# Patient Record
Sex: Female | Born: 1970 | Race: Black or African American | Hispanic: No | Marital: Single | Smoking: Never smoker
Health system: Southern US, Community
[De-identification: ages and names within clinical notes are randomized; demographics above are authoritative.]

## PROBLEM LIST (undated history)

## (undated) DIAGNOSIS — J45909 Unspecified asthma, uncomplicated: Secondary | ICD-10-CM

## (undated) DIAGNOSIS — E785 Hyperlipidemia, unspecified: Secondary | ICD-10-CM

## (undated) DIAGNOSIS — I10 Essential (primary) hypertension: Secondary | ICD-10-CM

## (undated) HISTORY — DX: Hyperlipidemia, unspecified: E78.5

## (undated) HISTORY — DX: Essential (primary) hypertension: I10

## (undated) HISTORY — DX: Unspecified asthma, uncomplicated: J45.909

## (undated) HISTORY — PX: TUBAL LIGATION: SHX77

---

## 2004-06-01 ENCOUNTER — Emergency Department (HOSPITAL_COMMUNITY): Admission: EM | Admit: 2004-06-01 | Discharge: 2004-06-01 | Payer: Self-pay | Admitting: Family Medicine

## 2005-08-09 ENCOUNTER — Inpatient Hospital Stay (HOSPITAL_COMMUNITY): Admission: AD | Admit: 2005-08-09 | Discharge: 2005-08-09 | Payer: Self-pay | Admitting: *Deleted

## 2006-05-26 ENCOUNTER — Other Ambulatory Visit: Admission: RE | Admit: 2006-05-26 | Discharge: 2006-05-26 | Payer: Self-pay | Admitting: Obstetrics and Gynecology

## 2010-07-13 ENCOUNTER — Encounter: Payer: Self-pay | Admitting: Family Medicine

## 2011-09-15 ENCOUNTER — Ambulatory Visit (INDEPENDENT_AMBULATORY_CARE_PROVIDER_SITE_OTHER): Payer: BC Managed Care – PPO | Admitting: Internal Medicine

## 2011-09-15 ENCOUNTER — Telehealth: Payer: Self-pay

## 2011-09-15 VITALS — BP 128/90 | HR 80 | Temp 98.3°F | Resp 18 | Ht 64.25 in | Wt 186.0 lb

## 2011-09-15 DIAGNOSIS — E669 Obesity, unspecified: Secondary | ICD-10-CM

## 2011-09-15 DIAGNOSIS — Z833 Family history of diabetes mellitus: Secondary | ICD-10-CM

## 2011-09-15 DIAGNOSIS — R209 Unspecified disturbances of skin sensation: Secondary | ICD-10-CM

## 2011-09-15 DIAGNOSIS — M7989 Other specified soft tissue disorders: Secondary | ICD-10-CM

## 2011-09-15 DIAGNOSIS — R200 Anesthesia of skin: Secondary | ICD-10-CM

## 2011-09-15 DIAGNOSIS — D649 Anemia, unspecified: Secondary | ICD-10-CM

## 2011-09-15 LAB — POCT CBC
Granulocyte percent: 46.2 %G (ref 37–80)
HCT, POC: 35.2 % — AB (ref 37.7–47.9)
Hemoglobin: 11.2 g/dL — AB (ref 12.2–16.2)
Lymph, poc: 1.5 (ref 0.6–3.4)
MCH, POC: 29.3 pg (ref 27–31.2)
MCHC: 31.8 g/dL (ref 31.8–35.4)
MID (cbc): 0.3 (ref 0–0.9)
MPV: 10 fL (ref 0–99.8)
POC LYMPH PERCENT: 43.8 %L (ref 10–50)
Platelet Count, POC: 272 10*3/uL (ref 142–424)
RBC: 3.82 M/uL — AB (ref 4.04–5.48)
WBC: 3.5 10*3/uL — AB (ref 4.6–10.2)

## 2011-09-15 LAB — POCT UA - MICROSCOPIC ONLY
Crystals, Ur, HPF, POC: NEGATIVE
Yeast, UA: NEGATIVE

## 2011-09-15 LAB — POCT URINALYSIS DIPSTICK
Bilirubin, UA: NEGATIVE
Ketones, UA: NEGATIVE
Leukocytes, UA: NEGATIVE
pH, UA: 7.5

## 2011-09-15 LAB — COMPREHENSIVE METABOLIC PANEL
AST: 32 U/L (ref 0–37)
Alkaline Phosphatase: 66 U/L (ref 39–117)
BUN: 14 mg/dL (ref 6–23)
Calcium: 9.7 mg/dL (ref 8.4–10.5)
Creat: 0.94 mg/dL (ref 0.50–1.10)

## 2011-09-15 LAB — GLUCOSE, POCT (MANUAL RESULT ENTRY): POC Glucose: 76

## 2011-09-15 LAB — POCT GLYCOSYLATED HEMOGLOBIN (HGB A1C): Hemoglobin A1C: 5.2

## 2011-09-15 MED ORDER — TRIAMTERENE-HCTZ 37.5-25 MG PO TABS: 1.0000 | ORAL_TABLET | Freq: Every day | ORAL | Status: DC

## 2011-09-15 NOTE — Patient Instructions (Signed)
Take your Diuretic medication in the morning every day.  Keep track of how your swelling is, this should improve in 2-3 weeks.  Wear support hose if desired.  Keep legs propped up when at home and sitting.  Return in the numbness has not resolved in 1-2 months, return if you wish to stay on the medication for a recheck.

## 2011-09-15 NOTE — Progress Notes (Signed)
  Subjective:    Patient ID: Erin Mcguire, female    DOB: Dec 21, 1970, 41 y.o.   MRN: [id number]  HPI  Erin Mcguire is a 41 year old AA female, new to our practice, who comes in today complaining of swelling in her right ankle area for 6 months.  She first noticed it following a bus trip last October and went to a "foot doctor" in November who did doppler studies and found no clot.  Lately her right lower leg has felt more numb with the swelling, but no weakness.  She has not had a MD in years and has not had a mammogram of physical exam.  Generally she is healthy and takes no medications.  She is the mother of three children, has had a BTL.  Works at the [employer].    Review of Systems  Constitutional: Negative for activity change, appetite change, fatigue and unexpected weight change.  HENT: Negative.  Negative for facial swelling and rhinorrhea.   Eyes: Negative.   Respiratory: Negative.   Cardiovascular: Positive for leg swelling. Negative for chest pain.  Gastrointestinal: Negative.   Genitourinary: Negative.   Neurological: Positive for numbness.  Hematological: Negative.   Psychiatric/Behavioral: Negative.        Objective:   Physical Exam  Vitals reviewed. Constitutional: She is oriented to person, place, and time. She appears well-developed and well-nourished.  HENT:  Head: Normocephalic and atraumatic.  Right Ear: External ear normal.  Eyes: Conjunctivae are normal.  Neck: Neck supple. No thyromegaly present.  Cardiovascular: Normal rate, regular rhythm and normal heart sounds.   Pulmonary/Chest: Effort normal and breath sounds normal. She has no wheezes. She has no rales.  Abdominal: Soft. She exhibits no distension. There is no tenderness.  Musculoskeletal: Normal range of motion.  Lymphadenopathy:    She has no cervical adenopathy.  Neurological: She is alert and oriented to person, place, and time.  Skin: Skin is warm and dry.  Psychiatric: She has a normal mood and affect.  Her behavior is normal.  She has +2 edema in her right ankle only, toes are spared.  Normal DP pulse, normal DTR Right patellar.       Assessment & Plan:  Lower extremity edema:  Suspect secondary to decreased venous integrity will check TSH and CMP (pending).  Start Maxide daily.  Recheck 1 month.  Continue on MV daily, she also had some mild anemia and leukopenia today, ferritin level is pending also.  Return sooner if problems or leg pain arises, she agrees.  AVS printed and given to pt.

## 2011-09-16 ENCOUNTER — Encounter: Payer: Self-pay | Admitting: *Deleted

## 2012-04-19 ENCOUNTER — Ambulatory Visit (INDEPENDENT_AMBULATORY_CARE_PROVIDER_SITE_OTHER): Payer: BC Managed Care – PPO | Admitting: Emergency Medicine

## 2012-04-19 VITALS — BP 128/92 | HR 88 | Temp 98.1°F | Resp 18 | Ht 64.75 in | Wt 192.8 lb

## 2012-04-19 DIAGNOSIS — S43499A Other sprain of unspecified shoulder joint, initial encounter: Secondary | ICD-10-CM

## 2012-04-19 DIAGNOSIS — S46819A Strain of other muscles, fascia and tendons at shoulder and upper arm level, unspecified arm, initial encounter: Secondary | ICD-10-CM

## 2012-04-19 MED ORDER — NAPROXEN SODIUM 550 MG PO TABS: 550.0000 mg | ORAL_TABLET | Freq: Two times a day (BID) | ORAL | Status: AC

## 2012-04-19 MED ORDER — CYCLOBENZAPRINE HCL 10 MG PO TABS: 10.0000 mg | ORAL_TABLET | Freq: Three times a day (TID) | ORAL | Status: DC | PRN

## 2012-04-19 MED ORDER — HYDROCODONE-ACETAMINOPHEN 5-325 MG PO TABS
1.0000 | ORAL_TABLET | ORAL | Status: AC | PRN
Start: 2012-04-19 — End: 2012-04-29

## 2012-04-19 NOTE — Progress Notes (Signed)
[facility] [address] [place] [address] [phone]  Date:  04/19/2012   Name:  Erin Mcguire   DOB:  Aug 30, 1970   MRN:  [id number]  PCP:  No primary provider on file.    Chief Complaint: Headache and Neck Pain   History of Present Illness:  Erin Mcguire is a 41 y.o. very pleasant female patient who presents with the following:  Wednesday awoke with pain in the neck next day it progressed up into her back of her head.  No neuro symptoms or radiation of pain.  Pain increases when she rotates or bends her neck.  No history of trauma or overuse.  No vision or neurological symptoms.    There is no problem list on file for this patient.   History reviewed. No pertinent past medical history.  Past Surgical History  Procedure Date  . Tubal ligation     History  Substance Use Topics  . Smoking status: Never Smoker   . Smokeless tobacco: Not on file  . Alcohol Use: Not on file    Family History  Problem Relation Age of Onset  . Hypertension Mother   . Hypertension Father   . COPD Father   . Asthma Son   . Cancer Paternal Uncle   . Cancer Maternal Grandmother   . Stroke Paternal Grandmother   . Hypertension Sister   . Cancer Sister   . Alcohol abuse Maternal Uncle   . Heart disease Paternal Uncle     No Known Allergies  Medication list has been reviewed and updated.  Current Outpatient Prescriptions on File Prior to Visit  Medication Sig Dispense Refill  . triamterene-hydrochlorothiazide (MAXZIDE-25) 37.5-25 MG per tablet Take 1 each (1 tablet total) by mouth daily.  30 tablet  1    Review of Systems:  As per HPI, otherwise negative.    Physical Examination: Filed Vitals:   04/19/12 1446  BP: 128/92  Pulse: 88  Temp: 98.1 F (36.7 C)  Resp: 18   Filed Vitals:   04/19/12 1446  Height: 5' 4.75" (1.645 m)  Weight: 192 lb 12.8 oz (87.454 kg)   Body mass index is 32.33 kg/(m^2). Ideal Body Weight: Weight in (lb) to  have BMI = 25: 148.8   GEN: WDWN, NAD, Non-toxic, A & O x 3 HEENT: Atraumatic, Normocephalic. Neck supple. No masses, No LAD. Ears and Nose: No external deformity. NECK:  Marked spasm and tenderness right trapezius CV: RRR, No M/G/R. No JVD. No thrill. No extra heart sounds. PULM: CTA B, no wheezes, crackles, rhonchi. No retractions. No resp. distress. No accessory muscle use. ABD: S, NT, ND, +BS. No rebound. No HSM. EXTR: No c/c/e NEURO Normal gait, balance and coordination .  Normal muscle strength. PSYCH: Normally interactive. Conversant. Not depressed or anxious appearing.  Calm demeanor.    Assessment and Plan: Trapezius strain Flexeril Anaprox Hydrocodone Local heat Follow up as needed  Carmelina Dane, MD

## 2012-04-25 NOTE — Progress Notes (Signed)
Reviewed and agree.

## 2012-10-10 ENCOUNTER — Other Ambulatory Visit: Payer: Self-pay | Admitting: Internal Medicine

## 2014-02-03 ENCOUNTER — Ambulatory Visit (INDEPENDENT_AMBULATORY_CARE_PROVIDER_SITE_OTHER): Payer: BC Managed Care – PPO | Admitting: Emergency Medicine

## 2014-02-03 ENCOUNTER — Ambulatory Visit (INDEPENDENT_AMBULATORY_CARE_PROVIDER_SITE_OTHER): Payer: BC Managed Care – PPO

## 2014-02-03 VITALS — BP 128/88 | HR 65 | Temp 98.3°F | Resp 16 | Ht 64.75 in | Wt 207.4 lb

## 2014-02-03 DIAGNOSIS — M542 Cervicalgia: Secondary | ICD-10-CM

## 2014-02-03 MED ORDER — TRIAMTERENE-HCTZ 37.5-25 MG PO TABS: ORAL_TABLET | ORAL | Status: DC

## 2014-02-03 MED ORDER — CYCLOBENZAPRINE HCL 10 MG PO TABS: ORAL_TABLET | ORAL | Status: DC

## 2014-02-03 NOTE — Progress Notes (Signed)
   Subjective:    Patient ID: Erin Mcguire, female    DOB: 12/31/1970, 42 y.o.   MRN: [id number]  HPI    Review of Systems     Objective:   Physical Exam        Assessment & Plan:   

## 2014-02-03 NOTE — Progress Notes (Signed)
Subjective:   This chart was scribed for Erin Spare A. Cleta Alberts, MD by Arlan Organ, [facility] Scribe. This patient was seen in room 3 and the patient's care was started 11:02 AM.    Patient ID: Erin Mcguire, female    DOB: July 11, 1970, 43 y.o.   MRN: [id number]  Chief Complaint  Patient presents with  . Back Pain    Pain is located around L shoulder, x 2-3 days, Pt. states she possibly slept wrong  . Joint Swelling    Just the R ankle, no pain, just swelling, 2-3 days    HPI  HPI Comments: Erin Mcguire is a 43 y.o. female who [facility] complaining of constant, moderate L shoulder pain x 2-3 days that is unchanged. She describes discomfort as "sore". This pain is exacerbated with certain movements. No alleviating factors at this time. Pt has tried OTC pain medication without any noticeable improvement for symptoms. Pt also reports R lower extremity swelling that has been ongoing for several years now. She denies any tenderness to the area. Pt states she has been evaluated for this complaint in the past but denies any long term successful management. At time of visit, a doppler was performed without any acute abnormal findings. Pt was given a prescription for a dieretic with temporary improvement for symptoms. She admits to stopping the dieretic after a number or weeks. She denies any L lower extremity swelling. Pt possibly attributes onset of extremity swelling to a long uncomfortable bus drive to [place] several years ago. No fever or chills at this time. No known allergies to medications. No other concerns this visit.  There are no active problems to display for this patient.  Past Medical History  Diagnosis Date  . Asthma    Past Surgical History  Procedure Laterality Date  . Tubal ligation     No Known Allergies Prior to Admission medications   Medication Sig Start Date End Date Taking? Authorizing Provider  Multiple Vitamin  (MULTIVITAMIN) tablet Take 1 tablet by mouth daily.   Yes Historical Provider, MD  cyclobenzaprine (FLEXERIL) 10 MG tablet Take 1 tablet (10 mg total) by mouth 3 (three) times daily as needed for muscle spasms. 04/19/12   Phillips Odor, MD  triamterene-hydrochlorothiazide (MAXZIDE-25) 37.5-25 MG per tablet Take 1 each (1 tablet total) by mouth daily. 09/15/11 09/14/12  Rickard Patience, PA-C  triamterene-hydrochlorothiazide (MAXZIDE-25) 37.5-25 MG per tablet TAKE 1 TABLET DAILY 10/10/12   Nelva Nay, PA-C    Review of Systems  Constitutional: Negative for fever and chills.  Musculoskeletal: Positive for arthralgias (L shoulder) and joint swelling (R lower extremity).    Triage Vitals: BP 128/88  Pulse 65  Temp(Src) 98.3 F (36.8 C)  Resp 16  Ht 5' 4.75" (1.645 m)  Wt 207 lb 6.4 oz (94.076 kg)  BMI 34.77 kg/m2  SpO2 100%  LMP 01/20/2014   Objective:  Physical Exam  Nursing note and vitals reviewed. Constitutional: She is oriented to person, place, and time. She appears well-developed and well-nourished.  HENT:  Head: Normocephalic.  Eyes: EOM are normal.  Neck: Normal range of motion.  Pulmonary/Chest: Effort normal.  Abdominal: She exhibits no distension.  Musculoskeletal: Normal range of motion. She exhibits edema and tenderness.  Tenderness to palpation over L periscapular area 1 plus swelling of R leg No calf tenderness Pulses are normal  Neurological: She is alert and oriented to person, place, and time.  Psychiatric: She has a normal mood and affect.   [facility] reading (PRIMARY) by  Dr. Cleta Albertsaub is an artifact present on the PA view patient was not able to remove on the lateral view of the C-spine there appears to be normal alignment. C1 is not visualized the odontoid is not visualized. Patient declined to have her metal hairpiece removed   Assessment & Plan:  Will give a diuretic to take on a when necessary basis. Referral made to Dr. Guss BundeFeatherstone at the pain center. She  will use Flexeril along with ibuprofen for her shoulder discomfort I personally performed the services described in this documentation, which was scribed in my presence. The recorded information has been reviewed and is accurate.

## 2014-02-03 NOTE — Progress Notes (Deleted)
   Subjective:    Patient ID: Erin Mcguire, female    DOB: 04/05/1971, 43 y.o.   MRN: [id number]  HPI    Review of Systems     Objective:   Physical Exam        Assessment & Plan:

## 2014-02-05 ENCOUNTER — Other Ambulatory Visit: Payer: Self-pay | Admitting: Radiology

## 2014-02-05 DIAGNOSIS — I8390 Asymptomatic varicose veins of unspecified lower extremity: Secondary | ICD-10-CM

## 2014-02-05 DIAGNOSIS — M7989 Other specified soft tissue disorders: Secondary | ICD-10-CM

## 2014-11-03 ENCOUNTER — Ambulatory Visit (INDEPENDENT_AMBULATORY_CARE_PROVIDER_SITE_OTHER): Payer: BC Managed Care – PPO | Admitting: Physician Assistant

## 2014-11-03 VITALS — BP 150/90 | HR 72 | Temp 97.9°F | Resp 12 | Ht 65.0 in | Wt 213.0 lb

## 2014-11-03 DIAGNOSIS — J4521 Mild intermittent asthma with (acute) exacerbation: Secondary | ICD-10-CM | POA: Diagnosis not present

## 2014-11-03 DIAGNOSIS — Z8679 Personal history of other diseases of the circulatory system: Secondary | ICD-10-CM | POA: Diagnosis not present

## 2014-11-03 LAB — COMPREHENSIVE METABOLIC PANEL
ALBUMIN: 3.9 g/dL (ref 3.5–5.2)
ALK PHOS: 72 U/L (ref 39–117)
ALT: 36 U/L — ABNORMAL HIGH (ref 0–35)
AST: 32 U/L (ref 0–37)
BUN: 11 mg/dL (ref 6–23)
CALCIUM: 9.2 mg/dL (ref 8.4–10.5)
CO2: 22 mEq/L (ref 19–32)
CREATININE: 0.9 mg/dL (ref 0.50–1.10)
Chloride: 105 mEq/L (ref 96–112)
Glucose, Bld: 89 mg/dL (ref 70–99)
Potassium: 4 mEq/L (ref 3.5–5.3)
Sodium: 137 mEq/L (ref 135–145)
Total Bilirubin: 0.4 mg/dL (ref 0.2–1.2)
Total Protein: 6.9 g/dL (ref 6.0–8.3)

## 2014-11-03 MED ORDER — IPRATROPIUM BROMIDE 0.02 % IN SOLN
0.5000 mg | Freq: Once | RESPIRATORY_TRACT | Status: AC
  Administered 2014-11-03: 0.5 mg via RESPIRATORY_TRACT

## 2014-11-03 MED ORDER — BECLOMETHASONE DIPROPIONATE 40 MCG/ACT IN AERS: 2.0000 | INHALATION_SPRAY | Freq: Two times a day (BID) | RESPIRATORY_TRACT | Status: DC

## 2014-11-03 MED ORDER — ALBUTEROL SULFATE (2.5 MG/3ML) 0.083% IN NEBU: 2.5000 mg | INHALATION_SOLUTION | Freq: Four times a day (QID) | RESPIRATORY_TRACT | Status: DC | PRN

## 2014-11-03 MED ORDER — METHYLPREDNISOLONE ACETATE 80 MG/ML IJ SUSP
80.0000 mg | Freq: Once | INTRAMUSCULAR | Status: AC
  Administered 2014-11-03: 80 mg via INTRAMUSCULAR

## 2014-11-03 MED ORDER — ALBUTEROL SULFATE HFA 108 (90 BASE) MCG/ACT IN AERS: 2.0000 | INHALATION_SPRAY | RESPIRATORY_TRACT | Status: DC | PRN

## 2014-11-03 MED ORDER — ALBUTEROL SULFATE (2.5 MG/3ML) 0.083% IN NEBU
2.5000 mg | INHALATION_SOLUTION | Freq: Once | RESPIRATORY_TRACT | Status: AC
  Administered 2014-11-03: 2.5 mg via RESPIRATORY_TRACT

## 2014-11-03 NOTE — Patient Instructions (Signed)

## 2014-11-03 NOTE — Progress Notes (Signed)
11/03/2014 at 10:27 AM  Erin Mcguire / DOB: 1971/04/16 / MRN: [id number]  The patient  does not have a problem list on file.  SUBJECTIVE  Chief complaint: Allergies; Asthma; Wheezing; and Shortness of Breath  Patient here for the treatment of asthma.  She reports this allergy season has been particularly bad for her and she has been using her son's nebulizer treatments and rescue inhalers, daily, for the relief of SOB and chest tightness.  She reports some wheezing at night as well.  She does not have any asthma medications of her own today.  She has a long history of asthma that started when she was [age].    She  has a past medical history of Asthma.    Medications reviewed and updated by myself where necessary, and exist elsewhere in the encounter.   Erin Mcguire has No Known Allergies. She  reports that she has never smoked. She has never used smokeless tobacco. She  has no sexual activity history on file. The patient  has past surgical history that includes Tubal ligation.  Her family history includes Alcohol abuse in her maternal uncle; Asthma in her son; COPD in her father; Cancer in her maternal grandmother, paternal uncle, and sister; Heart disease in her paternal uncle; Hypertension in her father, mother, and sister; Stroke in her paternal grandmother.  Review of Systems  Constitutional: Negative for fever and chills.  Respiratory: Positive for cough, shortness of breath and wheezing. Negative for sputum production.   Cardiovascular: Negative for chest pain.  Gastrointestinal: Negative for heartburn and nausea.  Genitourinary: Negative for dysuria.  Musculoskeletal: Negative for myalgias.  Skin: Negative for itching and rash.  Neurological: Negative for dizziness and headaches.    OBJECTIVE  Her  height is 5\' 5"  (1.651 m) and weight is 213 lb (96.616 kg). Her oral temperature is 97.9 F (36.6 C). Her blood pressure is 150/90 and her pulse is 72. Her respiration is 12  and oxygen saturation is 99%.  The patient's body mass index is 35.44 kg/(m^2).  Physical Exam  Constitutional: She is oriented to person, place, and time. She appears well-developed and well-nourished. No distress.  HENT:  Right Ear: Hearing, tympanic membrane, external ear and ear canal normal.  Left Ear: Hearing, tympanic membrane, external ear and ear canal normal.  Nose: Mucosal edema present. Right sinus exhibits no maxillary sinus tenderness and no frontal sinus tenderness. Left sinus exhibits no maxillary sinus tenderness and no frontal sinus tenderness.  Mouth/Throat: Uvula is midline, oropharynx is clear and moist and mucous membranes are normal.  Cardiovascular: Normal rate, regular rhythm and normal heart sounds.   Respiratory: Effort normal and breath sounds normal. She has no wheezes. She has no rales.  Neurological: She is alert and oriented to person, place, and time.  Skin: Skin is warm and dry. She is not diaphoretic.  Psychiatric: She has a normal mood and affect.     Pre neb peak flow: 210:  48% of predicted.  Post neb peak flow: 400: 93% of predicted.   No results found for this or any previous visit (from the past 24 hour(s)).  ASSESSMENT & PLAN  Erin Mcguire was seen today for allergies, asthma, wheezing and shortness of breath.  Diagnoses and all orders for this visit:  Asthma, chronic, mild intermittent, with acute exacerbation: Patient with diagnosis and symptoms since childhood and has not been treating herself adequately. Patient needing albuterol inhalers and nebs daily. Given poor initial peak flow I feel  that this patient would benefit greatly for a daily steroid inhaler.  Excellent reversibility with nebulized albuterol/atrovent.  Will calm this flare now with depomedrol. Patient to follow up with me in two weeks for reassessment.  Orders: -     albuterol (PROVENTIL) (2.5 MG/3ML) 0.083% nebulizer solution 2.5 mg; Take 3 mLs (2.5 mg total) by nebulization  once. -     ipratropium (ATROVENT) nebulizer solution 0.5 mg; Take 2.5 mLs (0.5 mg total) by nebulization once. -     methylPREDNISolone acetate (DEPO-MEDROL) injection 80 mg; Inject 1 mL (80 mg total) into the muscle once. -     beclomethasone (QVAR) 40 MCG/ACT inhaler; Inhale 2 puffs into the lungs 2 (two) times daily. -     albuterol (PROVENTIL HFA;VENTOLIN HFA) 108 (90 BASE) MCG/ACT inhaler; Inhale 2 puffs into the lungs every 4 (four) hours as needed for wheezing or shortness of breath (cough, shortness of breath or wheezing.). -     albuterol (PROVENTIL) (2.5 MG/3ML) 0.083% nebulizer solution; Take 3 mLs (2.5 mg total) by nebulization every 6 (six) hours as needed for wheezing or shortness of breath.  History of hypertension Orders: -     Comprehensive metabolic panel    The patient was advised to call or come back to clinic if she does not see an improvement in symptoms, or worsens with the above plan.   Deliah BostonMichael Clark, MHS, PA-C [facility] 11/03/2014 10:27 AM

## 2014-11-08 ENCOUNTER — Telehealth: Payer: Self-pay | Admitting: Emergency Medicine

## 2014-11-08 NOTE — Telephone Encounter (Signed)
Please advise 

## 2014-11-08 NOTE — Telephone Encounter (Signed)
Rx sent 

## 2014-12-14 IMAGING — CR DG CERVICAL SPINE 2 OR 3 VIEWS
2 series · 2 of 2 positions shown · non-contrast
Comparison: None.

CLINICAL DATA: Neck pain

EXAM:
CERVICAL SPINE - 2-3 VIEW

[lateral]
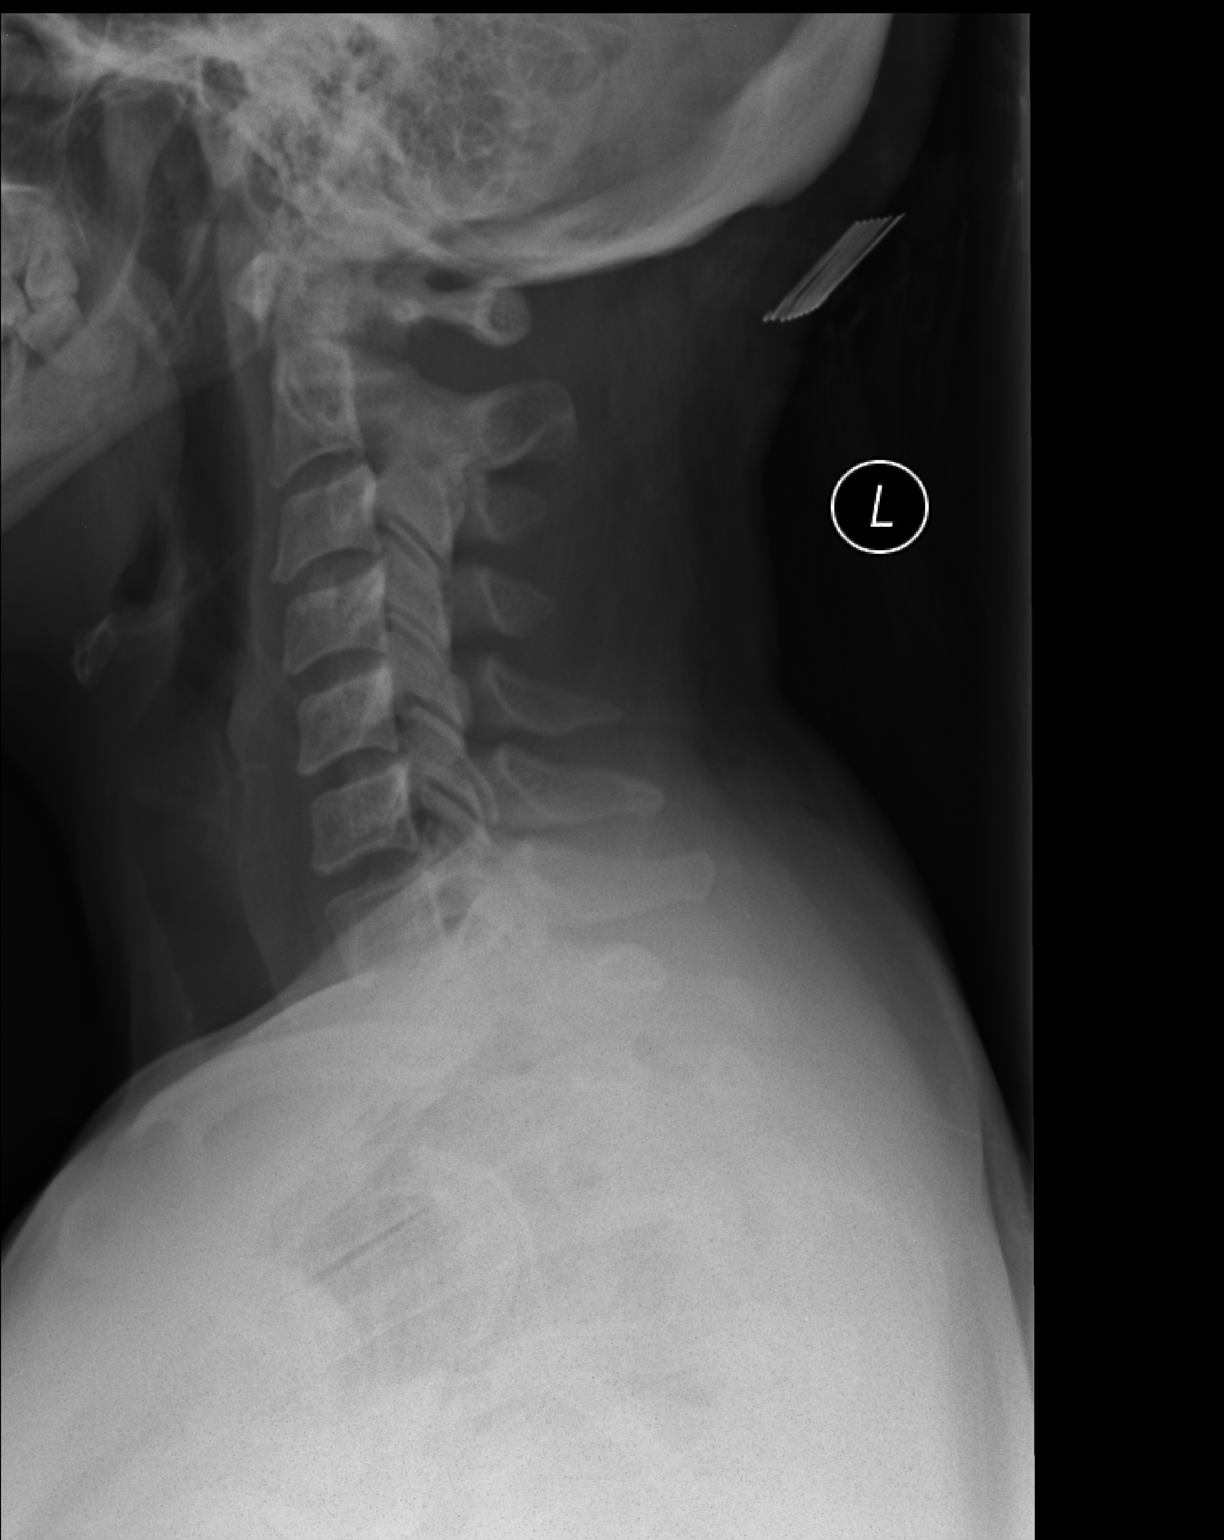

[AP]
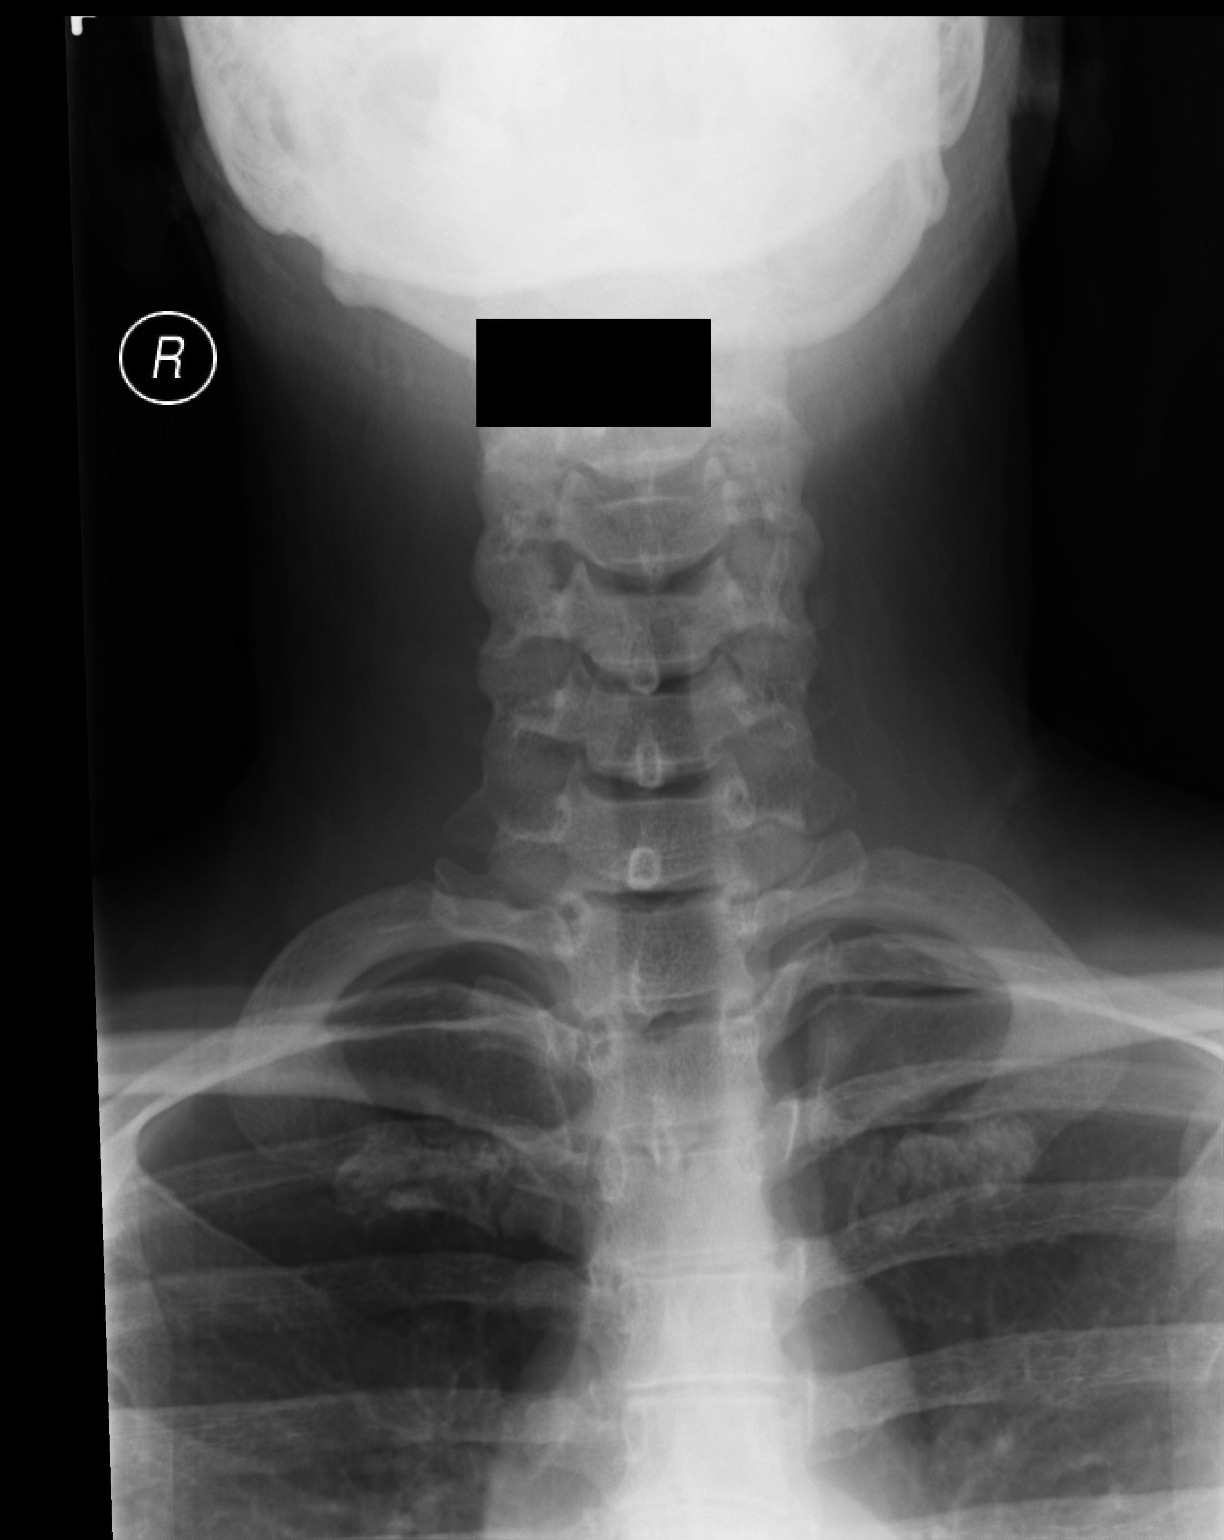

[2 of 2 positions shown; findings below may reference images not displayed]

FINDINGS: No fracture. No spondylolisthesis. There are no degenerative
changes. Normal soft tissues.
IMPRESSION: Negative exam

## 2014-12-17 ENCOUNTER — Other Ambulatory Visit: Payer: Self-pay | Admitting: Physician Assistant

## 2015-01-21 ENCOUNTER — Other Ambulatory Visit: Payer: Self-pay | Admitting: Physician Assistant

## 2015-04-03 ENCOUNTER — Other Ambulatory Visit: Payer: Self-pay | Admitting: Physician Assistant

## 2015-04-04 ENCOUNTER — Other Ambulatory Visit: Payer: Self-pay | Admitting: Physician Assistant

## 2015-04-06 ENCOUNTER — Telehealth: Payer: Self-pay

## 2015-04-06 MED ORDER — TRIAMTERENE-HCTZ 37.5-25 MG PO TABS: ORAL_TABLET | ORAL | Status: DC

## 2015-04-06 NOTE — Telephone Encounter (Signed)
Pt needs a refill on the triamterene-hydrochlorothiazide (MAXZIDE-25) 37.5-25 MG per tablet [[id number]][[id number]]  She has been taking them once a day as needed. Please refill.

## 2015-04-06 NOTE — Telephone Encounter (Signed)
Meds ordered this encounter  Medications  . triamterene-hydrochlorothiazide (MAXZIDE-25) 37.5-25 MG tablet    Sig: TAKE ONE TABLET ONCE OR TWICE A WEEK FOR SWELLING.    Dispense:  30 tablet    Refill:  3    Please advise patient to return for evaluation in 3-4 months.    Order Specific Question:  Supervising Provider    Answer:  Tonye PearsonOLITTLE, ROBERT P [[id number]]   Patient notified by phone.

## 2015-04-20 ENCOUNTER — Other Ambulatory Visit: Payer: Self-pay | Admitting: Physician Assistant

## 2015-06-12 ENCOUNTER — Other Ambulatory Visit: Payer: Self-pay | Admitting: Physician Assistant

## 2015-08-28 ENCOUNTER — Other Ambulatory Visit: Payer: Self-pay | Admitting: Physician Assistant

## 2015-09-07 ENCOUNTER — Ambulatory Visit (INDEPENDENT_AMBULATORY_CARE_PROVIDER_SITE_OTHER): Payer: BC Managed Care – PPO | Admitting: Physician Assistant

## 2015-09-07 VITALS — BP 110/68 | HR 72 | Temp 97.3°F | Resp 12 | Ht 65.0 in | Wt 182.0 lb

## 2015-09-07 DIAGNOSIS — J4540 Moderate persistent asthma, uncomplicated: Secondary | ICD-10-CM | POA: Diagnosis not present

## 2015-09-07 DIAGNOSIS — I10 Essential (primary) hypertension: Secondary | ICD-10-CM

## 2015-09-07 DIAGNOSIS — Z23 Encounter for immunization: Secondary | ICD-10-CM | POA: Diagnosis not present

## 2015-09-07 MED ORDER — ALBUTEROL SULFATE HFA 108 (90 BASE) MCG/ACT IN AERS: INHALATION_SPRAY | RESPIRATORY_TRACT | Status: DC

## 2015-09-07 MED ORDER — TRIAMTERENE-HCTZ 37.5-25 MG PO TABS: ORAL_TABLET | ORAL | Status: DC

## 2015-09-07 MED ORDER — ALBUTEROL SULFATE (2.5 MG/3ML) 0.083% IN NEBU: 2.5000 mg | INHALATION_SOLUTION | Freq: Four times a day (QID) | RESPIRATORY_TRACT | Status: DC | PRN

## 2015-09-07 MED ORDER — BECLOMETHASONE DIPROPIONATE 80 MCG/ACT IN AERS: 2.0000 | INHALATION_SPRAY | Freq: Two times a day (BID) | RESPIRATORY_TRACT | Status: DC

## 2015-09-07 NOTE — Progress Notes (Signed)
[facility] [address], [place] [address] [phone]  Date:  09/07/2015   Name:  Erin Mcguire   DOB:  1970-06-25   MRN:  [id number]  PCP:  No primary care provider on file.    Chief Complaint: Medication Refill   History of Present Illness:  This is a 45 y.o. female with PMH asthma and HTN who is presenting needing med refills. In May of 2016 she was started on qvar 40 BID for uncontrolled asthma. States that did help a lot. She continues to use albuterol inhaler or nebulizer twice a day. States often wakes with symptoms or has symptoms when going to bed. A lot of times before bed she will having coughing fits. She is not have any symptoms currently. She does have allergic rhinitis, which states has been bad that past 1.5 weeks. Just started back taking allegra daily. Allegra has helped some.  Pt needing refills of her maxzide. She was originally prescribed 3 years ago for swelling in legs. Was only to take 1-2 times per week for swelling. Was changed to daily 1 year ago when BP was noted to be elevated to 150/90. She has been taking daily since and tolerating well.  Pt got flu shot 1 month ago at work. Works at [employer].  She cannot recall last tdap.  She plans to return soon for CPE.  Review of Systems:  Review of Systems See HPI  There are no active problems to display for this patient.  Prior to Admission medications   Medication Sig Start Date End Date Taking? Authorizing Provider  beclomethasone (QVAR) 40 MCG/ACT inhaler Inhale 2 puffs into the lungs 2 (two) times daily. 11/03/14  Yes Ofilia Neas, PA-C  Multiple Vitamin (MULTIVITAMIN) tablet Take 1 tablet by mouth daily.   Yes Historical Provider, MD  triamterene-hydrochlorothiazide (MAXZIDE-25) 37.5-25 MG tablet TAKE ONE TABLET ONCE OR TWICE A WEEK FOR SWELLING. 04/06/15  Yes Chelle Jeffery, PA-C  VENTOLIN HFA 108 (90 Base) MCG/ACT inhaler INHALE TWO PUFFS INTO THE LUNGS EVERY 4 HOURS AS  NEEDED FOR WHEEZING/SHORTNESS OF BREATH/COUGH 08/28/15  Yes Ofilia Neas, PA-C    No Known Allergies  Past Surgical History  Procedure Laterality Date  . Tubal ligation      Social History  Substance Use Topics  . Smoking status: Never Smoker   . Smokeless tobacco: Never Used  . Alcohol Use: None    Family History  Problem Relation Age of Onset  . Hypertension Mother   . Hypertension Father   . COPD Father   . Asthma Son   . Cancer Paternal Uncle   . Cancer Maternal Grandmother   . Stroke Paternal Grandmother   . Hypertension Sister   . Cancer Sister   . Alcohol abuse Maternal Uncle   . Heart disease Paternal Uncle     Medication list has been reviewed and updated.  Physical Examination:  Physical Exam  Constitutional: She is oriented to person, place, and time. She appears well-developed and well-nourished. No distress.  HENT:  Head: Normocephalic and atraumatic.  Right Ear: Hearing normal.  Left Ear: Hearing normal.  Nose: Nose normal.  Eyes: Conjunctivae and lids are normal. Right eye exhibits no discharge. Left eye exhibits no discharge. No scleral icterus.  Neck: Trachea normal. Carotid bruit is not present. No thyromegaly present.  Cardiovascular: Normal rate, regular rhythm, normal heart sounds and normal pulses.   No murmur heard. Pulmonary/Chest: Effort normal and breath sounds normal.  No respiratory distress. She has no wheezes. She has no rhonchi. She has no rales.  Musculoskeletal: Normal range of motion.  Lymphadenopathy:       Head (right side): No submental, no submandibular and no tonsillar adenopathy present.       Head (left side): No submental, no submandibular and no tonsillar adenopathy present.    She has no cervical adenopathy.  Neurological: She is alert and oriented to person, place, and time.  Skin: Skin is warm, dry and intact. No lesion and no rash noted.  Psychiatric: She has a normal mood and affect. Her speech is normal and  behavior is normal. Thought content normal.   BP 110/68 mmHg  Pulse 72  Temp(Src) 97.3 F (36.3 C) (Oral)  Resp 12  Ht 5\' 5"  (1.651 m)  Wt 182 lb (82.555 kg)  BMI 30.29 kg/m2  SpO2 98%  Assessment and Plan:  1. Asthma, moderate persistent, uncomplicated Asthma remains uncontrolled even though compliant with qvar. Uses albuterol BID. Refilled ventolin and neb soln. Increase qvar to 80 BID. Continue claritin daily. Return for CPE. And follow up then. - beclomethasone (QVAR) 80 MCG/ACT inhaler; Inhale 2 puffs into the lungs 2 (two) times daily.  Dispense: 1 Inhaler; Refill: 12 - albuterol (VENTOLIN HFA) 108 (90 Base) MCG/ACT inhaler; INHALE TWO PUFFS INTO THE LUNGS EVERY 4 HOURS AS NEEDED FOR WHEEZING/SHORTNESS OF BREATH/COUGH  Dispense: 1 Inhaler; Refill: 11 - albuterol (PROVENTIL) (2.5 MG/3ML) 0.083% nebulizer solution; Take 3 mLs (2.5 mg total) by nebulization every 6 (six) hours as needed for wheezing or shortness of breath.  Dispense: 75 mL; Refill: 12  2. Need for Tdap vaccination - Tdap vaccine greater than or equal to 7yo IM  3. Essential hypertension - triamterene-hydrochlorothiazide (MAXZIDE-25) 37.5-25 MG tablet; TAKE ONE TABLET PO DAILY  Dispense: 30 tablet; Refill: 5   Allyn Bartelson V. Dyke BrackettBush, PA-C, MHS [facility]  09/07/2015

## 2015-09-07 NOTE — Patient Instructions (Addendum)
Change to new dose qvar - 2 puffs twice a day. Rinse mouth after use. Albuterol inhaler or neb as needed -- goal to not have to use daily. Your blood pressure medicine has been changed. Return at your convenience for complete physical - come fasting for blood work.    IF you received an x-ray today, you will receive an invoice from [facility]. Please contact [facility] at [phone] with questions or concerns regarding your invoice.   IF you received labwork today, you will receive an invoice from [facility]. Please contact [facility] at [phone] with questions or concerns regarding your invoice.   Our billing staff will not be able to assist you with questions regarding bills from these companies.  You will be contacted with the lab results as soon as they are available. The fastest way to get your results is to activate your My Chart account. Instructions are located on the last page of this paperwork. If you have not heard from us regarding the results in 2 weeks, please contact this office.

## 2015-09-21 ENCOUNTER — Other Ambulatory Visit: Payer: Self-pay | Admitting: Physician Assistant

## 2016-01-22 ENCOUNTER — Other Ambulatory Visit: Payer: Self-pay | Admitting: Physician Assistant

## 2016-01-22 DIAGNOSIS — J4540 Moderate persistent asthma, uncomplicated: Secondary | ICD-10-CM

## 2016-01-24 ENCOUNTER — Other Ambulatory Visit: Payer: Self-pay | Admitting: Physician Assistant

## 2016-01-26 ENCOUNTER — Other Ambulatory Visit: Payer: Self-pay | Admitting: Physician Assistant

## 2016-01-27 ENCOUNTER — Other Ambulatory Visit: Payer: Self-pay

## 2016-03-20 ENCOUNTER — Ambulatory Visit (INDEPENDENT_AMBULATORY_CARE_PROVIDER_SITE_OTHER): Payer: BC Managed Care – PPO | Admitting: Family Medicine

## 2016-03-20 ENCOUNTER — Ambulatory Visit (INDEPENDENT_AMBULATORY_CARE_PROVIDER_SITE_OTHER): Payer: BC Managed Care – PPO

## 2016-03-20 VITALS — BP 118/74 | HR 79 | Temp 98.4°F | Resp 18 | Ht 65.0 in | Wt 178.0 lb

## 2016-03-20 DIAGNOSIS — R109 Unspecified abdominal pain: Secondary | ICD-10-CM

## 2016-03-20 DIAGNOSIS — K5901 Slow transit constipation: Secondary | ICD-10-CM | POA: Diagnosis not present

## 2016-03-20 DIAGNOSIS — J4540 Moderate persistent asthma, uncomplicated: Secondary | ICD-10-CM | POA: Diagnosis not present

## 2016-03-20 DIAGNOSIS — I10 Essential (primary) hypertension: Secondary | ICD-10-CM | POA: Diagnosis not present

## 2016-03-20 DIAGNOSIS — R10A2 Flank pain, left side: Secondary | ICD-10-CM

## 2016-03-20 LAB — POC MICROSCOPIC URINALYSIS (UMFC): Mucus: ABSENT

## 2016-03-20 LAB — POCT URINALYSIS DIP (MANUAL ENTRY)
Bilirubin, UA: NEGATIVE
Glucose, UA: NEGATIVE
Ketones, POC UA: NEGATIVE
Leukocytes, UA: NEGATIVE
Nitrite, UA: NEGATIVE
PH UA: 5.5
PROTEIN UA: NEGATIVE
UROBILINOGEN UA: 0.2

## 2016-03-20 LAB — POCT URINE PREGNANCY: PREG TEST UR: NEGATIVE

## 2016-03-20 MED ORDER — ALBUTEROL SULFATE (2.5 MG/3ML) 0.083% IN NEBU: 2.5000 mg | INHALATION_SOLUTION | Freq: Four times a day (QID) | RESPIRATORY_TRACT | 12 refills | Status: DC | PRN

## 2016-03-20 MED ORDER — TRIAMTERENE-HCTZ 37.5-25 MG PO TABS: ORAL_TABLET | ORAL | 5 refills | Status: DC

## 2016-03-20 MED ORDER — BECLOMETHASONE DIPROPIONATE 80 MCG/ACT IN AERS: 2.0000 | INHALATION_SPRAY | Freq: Two times a day (BID) | RESPIRATORY_TRACT | 12 refills | Status: DC

## 2016-03-20 MED ORDER — POLYETHYLENE GLYCOL 3350 17 GM/SCOOP PO POWD: 17.0000 g | Freq: Two times a day (BID) | ORAL | 1 refills | Status: AC | PRN

## 2016-03-20 MED ORDER — SILVER SULFADIAZINE 1 % EX CREA: 1.0000 "application " | TOPICAL_CREAM | Freq: Two times a day (BID) | CUTANEOUS | 0 refills | Status: DC

## 2016-03-20 MED ORDER — ALBUTEROL SULFATE HFA 108 (90 BASE) MCG/ACT IN AERS: INHALATION_SPRAY | RESPIRATORY_TRACT | 11 refills | Status: DC

## 2016-03-20 MED ORDER — TRAMADOL HCL 50 MG PO TABS: 50.0000 mg | ORAL_TABLET | Freq: Three times a day (TID) | ORAL | 0 refills | Status: DC | PRN

## 2016-03-20 NOTE — Progress Notes (Signed)
Patient ID: Erin Mcguire, female    DOB: 28-Feb-1971, 45 y.o.   MRN: [id number]  PCP: No primary care provider on file.  Chief Complaint  Patient presents with  . Flank Pain    Subjective:   HPI Presents for evaluation of right lower flank and abdominal pain.  Right side flank pain radiates into the lower abdominal region. Urine odor has increased in strength and she noticed a more concentrated urine. Her last urinary tract infection was several years ago. Denies fever or night sweats, but she reports increased jitteriness. Describes the quality of pain as a burning sensation.   Social History   Social History  . Marital status: Single    Spouse name: N/A  . Number of children: N/A  . Years of education: N/A   Occupational History  . Not on file.   Social History Main Topics  . Smoking status: Never Smoker  . Smokeless tobacco: Never Used  . Alcohol use Not on file  . Drug use: Unknown  . Sexual activity: Not on file   Other Topics Concern  . Not on file   Social History Narrative  . No narrative on file   Family History  Problem Relation Age of Onset  . Hypertension Mother   . Hypertension Father   . COPD Father   . Asthma Son   . Cancer Paternal Uncle   . Cancer Maternal Grandmother   . Stroke Paternal Grandmother   . Hypertension Sister   . Cancer Sister   . Alcohol abuse Maternal Uncle   . Heart disease Paternal Uncle     Review of Systems See HPI    There are no active problems to display for this patient.    Prior to Admission medications   Medication Sig Start Date End Date Taking? Authorizing Provider  albuterol (PROVENTIL) (2.5 MG/3ML) 0.083% nebulizer solution Take 3 mLs (2.5 mg total) by nebulization every 6 (six) hours as needed for wheezing or shortness of breath. 09/07/15  Yes Dorna LeitzNicole V Bush, PA-C  albuterol (PROVENTIL) (2.5 MG/3ML) 0.083%  nebulizer solution USE 1 VIAL IN NEBULIZER EVERY 6 HOURS AS NEEDED FOR WHEEZING OR SHORTNESS OF BREATH 01/27/16  Yes Judeth CornfieldStephanie D English, PA  albuterol (VENTOLIN HFA) 108 (90 Base) MCG/ACT inhaler INHALE TWO PUFFS INTO THE LUNGS EVERY 4 HOURS AS NEEDED FOR WHEEZING/SHORTNESS OF BREATH/COUGH 09/07/15  Yes Dorna LeitzNicole V Bush, PA-C  beclomethasone (QVAR) 80 MCG/ACT inhaler Inhale 2 puffs into the lungs 2 (two) times daily. 09/07/15  Yes Dorna LeitzNicole V Bush, PA-C  Multiple Vitamin (MULTIVITAMIN) tablet Take 1 tablet by mouth daily.   Yes Historical Provider, MD  triamterene-hydrochlorothiazide (MAXZIDE-25) 37.5-25 MG tablet TAKE ONE TABLET PO DAILY 09/07/15  Yes Dorna LeitzNicole V Bush, PA-C     No Known Allergies     Objective:  Physical Exam  Constitutional: She is oriented to person, place, and time. She appears well-developed and well-nourished.  HENT:  Head: Normocephalic and atraumatic.  Right Ear: External ear normal.  Left Ear: External ear normal.  Nose: Nose normal.  Mouth/Throat: Oropharynx is clear and moist.  Eyes: Conjunctivae and EOM are normal. Pupils are equal, round, and reactive to light.  Neck: Normal range of motion. Neck supple.  Cardiovascular: Normal rate, regular rhythm, normal heart sounds and intact distal pulses.   Pulmonary/Chest: Effort normal and breath sounds normal.  Abdominal: Soft. Bowel sounds are normal. She exhibits no distension and no mass. There is no tenderness. There is no rebound and no  guarding.  Musculoskeletal: Normal range of motion.  Neurological: She is alert and oriented to person, place, and time.  Skin: Skin is warm and dry.  Psychiatric: She has a normal mood and affect. Her behavior is normal. Judgment and thought content normal.   Vitals:   03/20/16 1644  BP: 118/74  Pulse: 79  Resp: 18  Temp: 98.4 F (36.9 C)  Dg Abd 2 Views  Result Date: 03/20/2016 CLINICAL DATA:  Right lower quadrant abdominal pain. EXAM: ABDOMEN - 2 VIEW COMPARISON:  None.  FINDINGS: No abnormal bowel dilatation is noted. Large amount of stool is noted throughout the colon. There is no evidence of free air. No radio-opaque calculi or other significant radiographic abnormality is seen. IMPRESSION: Large amount of stool seen throughout the colon concerning for constipation. No abnormal bowel dilatation is noted. Electronically Signed   By: Lupita Raider, M.D.   On: 03/20/2016 17:42   Assessment & Plan:  1. Left flank pain 2. Slow transit constipation  Actually lower lateral abdominal pain likely related to dilated intestinal tract from stool overload. Ruled out underlying urinary tract infection.  - POCT urinalysis dipstick - POCT Microscopic Urinalysis ([facility]) - POCT urine pregnancy  Plan: Start Mira-lax 2 times daily as needed to resolve constipation. Start colace stool softener daily until regular soft stool If pain continues, please follow-up as needed.   Patient requested refills on chronic medications for asthma and hypertension   3. Asthma, moderate persistent, uncomplicated-Stable with no recent flare-ups  - Albuterol (PROVENTIL) (2.5 MG/3ML) 0.083% nebulizer solution; Take 3 mLs (2.5 mg total) by nebulization every 6 (six) hours as needed for wheezing or shortness of breath.  Dispense: 75 mL; Refill: 12 - Albuterol (VENTOLIN HFA) 108 (90 Base) MCG/ACT inhaler; INHALE TWO PUFFS INTO THE LUNGS EVERY 4 HOURS AS NEEDED FOR WHEEZING/SHORTNESS OF BREATH/COUGH  Dispense: 1 Inhaler; Refill: 11 - Beclomethasone (QVAR) 80 MCG/ACT inhaler; Inhale 2 puffs into the lungs 2 (two) times daily.  Dispense: 1 Inhaler; Refill: 12  4. Essential hypertension, controlled - triamterene-hydrochlorothiazide (MAXZIDE-25) 37.5-25 MG tablet; TAKE ONE TABLET PO DAILY  Dispense: 30 tablet; Refill: 5   Godfrey Pick. Tiburcio Pea, MSN, FNP-C [facility]

## 2016-03-20 NOTE — Patient Instructions (Addendum)
Start Mira-lax  2 times daily as needed to resolve constipation. Start colace stool softener daily until regular soft stool If pain continues, please follow-up as needed.  IF you received an x-ray today, you will receive an invoice from [facility]. Please contact [facility] at [phone] with questions or concerns regarding your invoice.   IF you received labwork today, you will receive an invoice from [facility]. Please contact [facility] at [phone] with questions or concerns regarding your invoice.   Our billing staff will not be able to assist you with questions regarding bills from these companies.  You will be contacted with the lab results as soon as they are available. The fastest way to get your results is to activate your My Chart account. Instructions are located on the last page of this paperwork. If you have not heard from us regarding the results in 2 weeks, please contact this office.    Constipation, Adult Constipation is when a person has fewer than three bowel movements a week, has difficulty having a bowel movement, or has stools that are dry, hard, or larger than normal. As people grow older, constipation is more common. A low-fiber diet, not taking in enough fluids, and taking certain medicines may make constipation worse.  CAUSES   Certain medicines, such as antidepressants, pain medicine, iron supplements, antacids, and water pills.   Certain diseases, such as diabetes, irritable bowel syndrome (IBS), thyroid disease, or depression.   Not drinking enough water.   Not eating enough fiber-rich foods.   Stress or travel.   Lack of physical activity or exercise.   Ignoring the urge to have a bowel movement.   Using laxatives too much.  SIGNS AND SYMPTOMS   Having fewer than three bowel movements a week.   Straining to have a bowel movement.   Having stools that are hard, dry, or larger than normal.    Feeling full or bloated.   Pain in the lower abdomen.   Not feeling relief after having a bowel movement.  DIAGNOSIS  Your health care provider will take a medical history and perform a physical exam. Further testing may be done for severe constipation. Some tests may include:  A barium enema X-ray to examine your rectum, colon, and, sometimes, your small intestine.   A sigmoidoscopy to examine your lower colon.   A colonoscopy to examine your entire colon. TREATMENT  Treatment will depend on the severity of your constipation and what is causing it. Some dietary treatments include drinking more fluids and eating more fiber-rich foods. Lifestyle treatments may include regular exercise. If these diet and lifestyle recommendations do not help, your health care provider may recommend taking over-the-counter laxative medicines to help you have bowel movements. Prescription medicines may be prescribed if over-the-counter medicines do not work.  HOME CARE INSTRUCTIONS   Eat foods that have a lot of fiber, such as fruits, vegetables, whole grains, and beans.  Limit foods high in fat and processed sugars, such as french fries, hamburgers, cookies, candies, and soda.   A fiber supplement may be added to your diet if you cannot get enough fiber from foods.   Drink enough fluids to keep your urine clear or pale yellow.   Exercise regularly or as directed by your health care provider.   Go to the restroom when you have the urge to go. Do not hold it.   Only take over-the-counter or prescription medicines as directed by your health care provider. Do not  take other medicines for constipation without talking to your health care provider first.  SEEK IMMEDIATE MEDICAL CARE IF:   You have bright red blood in your stool.   Your constipation lasts for more than 4 days or gets worse.   You have abdominal or rectal pain.   You have thin, pencil-like stools.   You have  unexplained weight loss. MAKE SURE YOU:   Understand these instructions.  Will watch your condition.  Will get help right away if you are not doing well or get worse.   This information is not intended to replace advice given to you by your health care provider. Make sure you discuss any questions you have with your health care provider.   Document Released: 03/06/2004 Document Revised: 06/29/2014 Document Reviewed: 03/20/2013 Elsevier Interactive Patient Education [employer]

## 2016-06-10 ENCOUNTER — Ambulatory Visit: Payer: Worker's Compensation | Admitting: Physician Assistant

## 2016-06-10 ENCOUNTER — Telehealth: Payer: Self-pay | Admitting: *Deleted

## 2016-06-10 VITALS — BP 122/74 | HR 79 | Temp 97.7°F | Resp 18 | Ht 65.0 in | Wt 176.0 lb

## 2016-06-10 DIAGNOSIS — B889 Infestation, unspecified: Secondary | ICD-10-CM | POA: Diagnosis not present

## 2016-06-10 DIAGNOSIS — L282 Other prurigo: Secondary | ICD-10-CM

## 2016-06-10 MED ORDER — CLOBETASOL PROPIONATE 0.05 % EX CREA: 1.0000 "application " | TOPICAL_CREAM | Freq: Two times a day (BID) | CUTANEOUS | 0 refills | Status: DC

## 2016-06-10 MED ORDER — PERMETHRIN 5 % EX CREA: TOPICAL_CREAM | CUTANEOUS | 0 refills | Status: DC

## 2016-06-10 NOTE — Progress Notes (Signed)
   06/10/2016 5:09 PM   DOB: [birth year] / MRN: [id number]  SUBJECTIVE:  Erin Mcguire is a 45 y.o. female presenting for itchy rash that started yesterday.  Rash started on her neck and now she is having small areas of rash on the hands, wrist, torso, and feet. Denies itching on her face and scalp. She works for the [employer] and reports the office has been shut down due to a "scabies outbreak."  She has No Known Allergies.   She  has a past medical history of Asthma and Hypertension.    She  reports that she has never smoked. She has never used smokeless tobacco. She  has no sexual activity history on file. The patient  has a past surgical history that includes Tubal ligation.  Her family history includes Alcohol abuse in her maternal uncle; Asthma in her son; COPD in her father; Cancer in her maternal grandmother, paternal uncle, and sister; Heart disease in her paternal uncle; Hypertension in her father, mother, and sister; Stroke in her paternal grandmother.  Review of Systems  Constitutional: Negative for fever.  Skin: Positive for itching and rash.    The problem list and medications were reviewed and updated by myself where necessary and exist elsewhere in the encounter.   OBJECTIVE:  BP 122/74   Pulse 79   Temp 97.7 F (36.5 C) (Oral)   Resp 18   Ht 5\' 5"  (1.651 m)   Wt 176 lb (79.8 kg)   SpO2 98%   BMI 29.29 kg/m   Physical Exam  Constitutional: She is oriented to person, place, and time.  Cardiovascular: Normal rate and regular rhythm.   Pulmonary/Chest: Effort normal and breath sounds normal.  Neurological: She is alert and oriented to person, place, and time.  Skin: Skin is warm. Rash (miniscule papules about the wrists and hands. Similar rash seen about the posterior neck.  One linearly distributed rash about the left anterior formarm. ) noted.    No results found for this or any previous visit (from the past 72 hour(s)).  No results found.  ASSESSMENT AND  PLAN  Erin Mcguire was seen today for rash.  Diagnoses and all orders for this visit:  Mite infestation: Supplied enough for herself and her son.   -     permethrin (ACTICIN) 5 % cream; Apply from the neck down and leave on for 8 hours then shower.  Pruritic rash: Advised zyrtec for now.  She can fill temovate if needed.  -     clobetasol cream (TEMOVATE) 0.05 %; Apply 1 application topically 2 (two) times daily.    The patient is advised to call or return to clinic if she does not see an improvement in symptoms, or to seek the care of the closest emergency department if she worsens with the above plan.   Deliah BostonMichael Freedom Peddy, MHS, PA-C [facility] 06/10/2016 5:09 PM

## 2016-06-10 NOTE — Patient Instructions (Addendum)
     IF you received an x-ray today, you will receive an invoice from [facility]. Please contact [facility] at [phone] with questions or concerns regarding your invoice.   IF you received labwork today, you will receive an invoice from [facility]. Please contact [facility] at [phone] with questions or concerns regarding your invoice.   Our billing staff will not be able to assist you with questions regarding bills from these companies.  You will be contacted with the lab results as soon as they are available. The fastest way to get your results is to activate your My Chart account. Instructions are located on the last page of this paperwork. If you have not heard from us regarding the results in 2 weeks, please contact this office.     

## 2016-06-10 NOTE — Telephone Encounter (Signed)
Faxed RX TEMOVATE to pharmacy, per Deliah BostonMichael Clark PA-C. Confirmation page received at 5:22 PM.

## 2016-06-12 ENCOUNTER — Other Ambulatory Visit: Payer: BC Managed Care – PPO

## 2016-06-17 ENCOUNTER — Telehealth: Payer: Self-pay

## 2016-06-17 NOTE — Telephone Encounter (Signed)
Patient is calling back for a third time about scabies issue.  Please advise

## 2016-06-17 NOTE — Telephone Encounter (Signed)
PATIENT STATES SHE SAW MICHAEL CLARK A WEEK AGO AND HE DIAGNOSED HER WITH SCABIES. SHE SAID SHE IS STILL ITCHING BUT THE BREAKOUT IS GETTING BETTER. 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 BEST PHONE [phone] (CELL)  MBC

## 2016-06-18 ENCOUNTER — Ambulatory Visit (INDEPENDENT_AMBULATORY_CARE_PROVIDER_SITE_OTHER): Payer: Worker's Compensation | Admitting: Physician Assistant

## 2016-06-18 VITALS — BP 122/78 | HR 66 | Temp 97.5°F | Resp 18 | Ht 65.0 in | Wt 177.0 lb

## 2016-06-18 DIAGNOSIS — B889 Infestation, unspecified: Secondary | ICD-10-CM

## 2016-06-18 DIAGNOSIS — Z09 Encounter for follow-up examination after completed treatment for conditions other than malignant neoplasm: Secondary | ICD-10-CM

## 2016-06-18 DIAGNOSIS — L282 Other prurigo: Secondary | ICD-10-CM | POA: Diagnosis not present

## 2016-06-18 MED ORDER — HYDROXYZINE HCL 25 MG PO TABS: 12.5000 mg | ORAL_TABLET | Freq: Three times a day (TID) | ORAL | 0 refills | Status: DC | PRN

## 2016-06-18 NOTE — Telephone Encounter (Signed)
Erin Mcguire, please address pt's question below.

## 2016-06-18 NOTE — Patient Instructions (Addendum)
  Please refrain from using the permethrin wash at this time.   You can use this IF your symptoms of new rash have not reocurred, AND as long as you are not the scratching cause.  You can do it Wednesday if you still have the symptoms. You can use the benadryl anti-itch cream over the counter.   Please be aware that the vistaril may cause sedation, so do not operate heavy machinery and take precaution with walking, with this medicine.    IF you received an x-ray today, you will receive an invoice from [facility]. Please contact [facility] at [phone] with questions or concerns regarding your invoice.   IF you received labwork today, you will receive an invoice from [facility]. Please contact [facility] at [phone] with questions or concerns regarding your invoice.   Our billing staff will not be able to assist you with questions regarding bills from these companies.  You will be contacted with the lab results as soon as they are available. The fastest way to get your results is to activate your My Chart account. Instructions are located on the last page of this paperwork. If you have not heard from us regarding the results in 2 weeks, please contact this office.

## 2016-06-18 NOTE — Progress Notes (Deleted)
[facility] [address], [place] [address] [phone]  Date:  06/18/2016   Name:  Vinnie Langtoneresa D Zettel   DOB:  Mar 30, 1971   MRN:  [id number]  PCP:  No primary care provider on file.    History of Present Illness:  Vinnie Langtoneresa D Bennie is a 45 y.o. female patient who [facility]     There are no active problems to display for this patient.      No Known Allergies  Medication list has been reviewed and updated.  Current Outpatient Prescriptions on File Prior to Visit  Medication Sig Dispense Refill  . albuterol (PROVENTIL) (2.5 MG/3ML) 0.083% nebulizer solution USE 1 VIAL IN NEBULIZER EVERY 6 HOURS AS NEEDED FOR WHEEZING OR SHORTNESS OF BREATH 360 mL 0  . albuterol (PROVENTIL) (2.5 MG/3ML) 0.083% nebulizer solution Take 3 mLs (2.5 mg total) by nebulization every 6 (six) hours as needed for wheezing or shortness of breath. 75 mL 12  . albuterol (VENTOLIN HFA) 108 (90 Base) MCG/ACT inhaler INHALE TWO PUFFS INTO THE LUNGS EVERY 4 HOURS AS NEEDED FOR WHEEZING/SHORTNESS OF BREATH/COUGH 1 Inhaler 11  . beclomethasone (QVAR) 80 MCG/ACT inhaler Inhale 2 puffs into the lungs 2 (two) times daily. 1 Inhaler 12  . clobetasol cream (TEMOVATE) 0.05 % Apply 1 application topically 2 (two) times daily. 30 g 0  . Multiple Vitamin (MULTIVITAMIN) tablet Take 1 tablet by mouth daily.    . permethrin (ACTICIN) 5 % cream Apply from the neck down and leave on for 8 hours then shower. 120 g 0  . polyethylene glycol powder (GLYCOLAX/MIRALAX) powder Take 17 g by mouth 2 (two) times daily as needed. 3350 g 1  . traMADol (ULTRAM) 50 MG tablet Take 1 tablet (50 mg total) by mouth every 8 (eight) hours as needed. 30 tablet 0  . triamterene-hydrochlorothiazide (MAXZIDE-25) 37.5-25 MG tablet TAKE ONE TABLET PO DAILY 30 tablet 5   No current facility-administered medications on file prior to visit.     ROS   Physical Examination: BP 122/78 (BP Location: Right Arm, Patient Position:  Sitting, Cuff Size: Small)   Pulse 66   Temp 97.5 F (36.4 C) (Oral)   Resp 18   Ht 5\' 5"  (1.651 m)   Wt 177 lb (80.3 kg)   LMP 05/28/2016   SpO2 99%   BMI 29.45 kg/m  Ideal Body Weight: Weight in (lb) to have BMI = 25: 149.9  Physical Exam   Assessment and Plan: Vinnie Langtoneresa D Wilford is a 45 y.o. female who is here today  There are no diagnoses linked to this encounter.  Trena PlattStephanie English, PA-C [facility] 06/18/2016 6:36 PM

## 2016-06-19 ENCOUNTER — Telehealth: Payer: Self-pay

## 2016-06-19 NOTE — Telephone Encounter (Signed)
[place]oke [place] someone from [place] -- State of  is requiring pt to see a dermatologist. They have scheduled an apt today at 11 and need a referral/ov notes faxed over. Left note with Burna MortimerWanda -- I will send everything over once complete.  Also wanted to ensure that we have the correct w/c info. Billing address:  [address]   [place], [place] [id number] Fax Number: [phone]

## 2016-06-29 NOTE — Progress Notes (Signed)
     Vinnie Langtoneresa D Ream 1971-01-04 46 y.o.   Chief Complaint  Patient presents with  . Follow-up    SCABIES    Presents for evaluation of work-related complaint.  Date of Injury: 06/09/2016 by prior note  History of Present Illness: Patient returns for follow up from itchy rash that has been preseent for 8 days.  This was dxd as scabies after an outbreak at her workplace the [employer].  She was given permethin and topical steroid and advised to return if symptoms worsen.  Patient states that she continues to experience itching.  New lesion had erupted.  She has used the wash and kept on for hours before rinsing.  She has done this a second time 3 days later.  She applies the topical steroid but feels that they resolve itching is only temporary.  She denies any swelling.  No fever.     She has cleaned the home.      ROS ROS otherwise unremarkable unless listed above.    Current medications and allergies reviewed and updated. Past medical history, family history, social history have been reviewed and updated.   Physical Exam  Constitutional: She is oriented to person, place, and time and well-developed, well-nourished, and in no distress. No distress.  Cardiovascular: Normal rate and normal heart sounds.   No murmur heard. Pulmonary/Chest: Effort normal. No respiratory distress. She has no wheezes.  Neurological: She is alert and oriented to person, place, and time.  Skin: Skin is warm and dry. Rash (scabbed pustule at the interdigit of the left hand.  dry non-erythematous pinpoint scabbed papules.  scant papules with dry flaking scant along the extremities and upper right back) noted. She is not diaphoretic.     Assessment and Plan: 46 year old is here for follow up of rash. This appears to be resolving.  The pruritic symptoms may be secondary to dry skin after overuse of the permethin solution.   Given vistaril for itching.  Advised to moisturize daily, and over the counter  topical solutions.  rtc as needed. Pruritic rash - Plan: hydrOXYzine (ATARAX/VISTARIL) 25 MG tablet, Ambulatory referral to Dermatology  Mite infestation - Plan: hydrOXYzine (ATARAX/VISTARIL) 25 MG tablet, Ambulatory referral to Dermatology  Follow up - Plan: hydrOXYzine (ATARAX/VISTARIL) 25 MG tablet, Ambulatory referral to Dermatology  Trena PlattStephanie Dellas Guard, PA-C [facility] 1/8/20188:36 AM Addendum added dermatology consult per worker comp request.

## 2016-07-01 NOTE — Telephone Encounter (Signed)
I sent the referral.  Please see if they need this.

## 2016-07-07 ENCOUNTER — Other Ambulatory Visit: Payer: Self-pay | Admitting: Physician Assistant

## 2016-09-01 NOTE — Progress Notes (Signed)
Note reviewed, and agree with documentation and plan.  

## 2016-09-08 ENCOUNTER — Ambulatory Visit (INDEPENDENT_AMBULATORY_CARE_PROVIDER_SITE_OTHER): Payer: BC Managed Care – PPO | Admitting: Family Medicine

## 2016-09-08 VITALS — BP 125/79 | HR 83 | Temp 98.2°F | Resp 17 | Ht 66.5 in | Wt 178.0 lb

## 2016-09-08 DIAGNOSIS — J069 Acute upper respiratory infection, unspecified: Secondary | ICD-10-CM

## 2016-09-08 DIAGNOSIS — J4520 Mild intermittent asthma, uncomplicated: Secondary | ICD-10-CM

## 2016-09-08 DIAGNOSIS — R05 Cough: Secondary | ICD-10-CM | POA: Diagnosis not present

## 2016-09-08 DIAGNOSIS — R059 Cough, unspecified: Secondary | ICD-10-CM

## 2016-09-08 DIAGNOSIS — J4521 Mild intermittent asthma with (acute) exacerbation: Secondary | ICD-10-CM | POA: Diagnosis not present

## 2016-09-08 MED ORDER — ALBUTEROL SULFATE (2.5 MG/3ML) 0.083% IN NEBU: 2.5000 mg | INHALATION_SOLUTION | RESPIRATORY_TRACT | 1 refills | Status: DC | PRN

## 2016-09-08 MED ORDER — PREDNISONE 20 MG PO TABS: 40.0000 mg | ORAL_TABLET | Freq: Every day | ORAL | 0 refills | Status: DC

## 2016-09-08 MED ORDER — BENZONATATE 100 MG PO CAPS: 100.0000 mg | ORAL_CAPSULE | Freq: Three times a day (TID) | ORAL | 0 refills | Status: DC | PRN

## 2016-09-08 MED ORDER — HYDROCODONE-HOMATROPINE 5-1.5 MG/5ML PO SYRP: 2.5000 mL | ORAL_SOLUTION | Freq: Every evening | ORAL | 0 refills | Status: DC | PRN

## 2016-09-08 NOTE — Patient Instructions (Addendum)
Start prednisone this morning for asthma flare. Continue albuterol nebulizers every 4-6 hours as needed today and tomorrow. If you still require albuterol nebulizer frequently in the next 3 days, or any worsening of symptoms sooner return here or emergency room. For cough, albuterol should be primary treatment for any wheezing, shortness of breath cough. You can also use Tessalon Perles up to 3 times per day as needed. At bedtime, as long as you are not short of breath or wheezing, he can use 1/2-1 teaspoon of the hydrocodone cough syrup for the irritant cough. Again, do not use that if you are short or breath or wheezing as albuterol would be the correct treatment with those symptoms.   Return to the clinic or go to the nearest emergency room if any of your symptoms worsen or new symptoms occur. Marland Kitchen   Upper Respiratory Infection, Adult Most upper respiratory infections (URIs) are a viral infection of the air passages leading to the lungs. A URI affects the nose, throat, and upper air passages. The most common type of URI is nasopharyngitis and is typically referred to as "the common cold." URIs run their course and usually go away on their own. Most of the time, a URI does not require medical attention, but sometimes a bacterial infection in the upper airways can follow a viral infection. This is called a secondary infection. Sinus and middle ear infections are common types of secondary upper respiratory infections. Bacterial pneumonia can also complicate a URI. A URI can worsen asthma and chronic obstructive pulmonary disease (COPD). Sometimes, these complications can require emergency medical care and may be life threatening. What are the causes? Almost all URIs are caused by viruses. A virus is a type of germ and can spread from one person to another. What increases the risk? You may be at risk for a URI if:  You smoke.  You have chronic heart or lung disease.  You have a weakened defense  (immune) system.  You are very young or very old.  You have nasal allergies or asthma.  You work in crowded or poorly ventilated areas.  You work in health care facilities or schools. What are the signs or symptoms? Symptoms typically develop 2-3 days after you come in contact with a cold virus. Most viral URIs last 7-10 days. However, viral URIs from the influenza virus (flu virus) can last 14-18 days and are typically more severe. Symptoms may include:  Runny or stuffy (congested) nose.  Sneezing.  Cough.  Sore throat.  Headache.  Fatigue.  Fever.  Loss of appetite.  Pain in your forehead, behind your eyes, and over your cheekbones (sinus pain).  Muscle aches. How is this diagnosed? Your health care provider may diagnose a URI by:  Physical exam.  Tests to check that your symptoms are not due to another condition such as:  Strep throat.  Sinusitis.  Pneumonia.  Asthma. How is this treated? A URI goes away on its own with time. It cannot be cured with medicines, but medicines may be prescribed or recommended to relieve symptoms. Medicines may help:  Reduce your fever.  Reduce your cough.  Relieve nasal congestion. Follow these instructions at home:  Take medicines only as directed by your health care provider.  Gargle warm saltwater or take cough drops to comfort your throat as directed by your health care provider.  Use a warm mist humidifier or inhale steam from a shower to increase air moisture. This may make it easier to breathe.  Drink enough fluid to keep your urine clear or pale yellow.  Eat soups and other clear broths and maintain good nutrition.  Rest as needed.  Return to work when your temperature has returned to normal or as your health care provider advises. You may need to stay home longer to avoid infecting others. You can also use a face mask and careful hand washing to prevent spread of the virus.  Increase the usage of your  inhaler if you have asthma.  Do not use any tobacco products, including cigarettes, chewing tobacco, or electronic cigarettes. If you need help quitting, ask your health care provider. How is this prevented? The best way to protect yourself from getting a cold is to practice good hygiene.  Avoid oral or hand contact with people with cold symptoms.  Wash your hands often if contact occurs. There is no clear evidence that vitamin C, vitamin E, echinacea, or exercise reduces the chance of developing a cold. However, it is always recommended to get plenty of rest, exercise, and practice good nutrition. Contact a health care provider if:  You are getting worse rather than better.  Your symptoms are not controlled by medicine.  You have chills.  You have worsening shortness of breath.  You have brown or red mucus.  You have yellow or brown nasal discharge.  You have pain in your face, especially when you bend forward.  You have a fever.  You have swollen neck glands.  You have pain while swallowing.  You have white areas in the back of your throat. Get help right away if:  You have severe or persistent:  Headache.  Ear pain.  Sinus pain.  Chest pain.  You have chronic lung disease and any of the following:  Wheezing.  Prolonged cough.  Coughing up blood.  A change in your usual mucus.  You have a stiff neck.  You have changes in your:  Vision.  Hearing.  Thinking.  Mood. This information is not intended to replace advice given to you by your health care provider. Make sure you discuss any questions you have with your health care provider. Document Released: 12/02/2000 Document Revised: 02/09/2016 Document Reviewed: 09/13/2013 Elsevier Interactive Patient Education  2017 [employer]  Asthma, Adult Asthma is a recurring condition in which the airways tighten and narrow. Asthma can make it difficult to breathe. It can cause coughing, wheezing, and  shortness of breath. Asthma episodes, also called asthma attacks, range from minor to life-threatening. Asthma cannot be cured, but medicines and lifestyle changes can help control it. What are the causes? Asthma is believed to be caused by inherited (genetic) and environmental factors, but its exact cause is unknown. Asthma may be triggered by allergens, lung infections, or irritants in the air. Asthma triggers are different for each person. Common triggers include:  Animal dander.  Dust mites.  Cockroaches.  Pollen from trees or grass.  Mold.  Smoke.  Air pollutants such as dust, household cleaners, hair sprays, aerosol sprays, paint fumes, strong chemicals, or strong odors.  Cold air, weather changes, and winds (which increase molds and pollens in the air).  Strong emotional expressions such as crying or laughing hard.  Stress.  Certain medicines (such as aspirin) or types of drugs (such as beta-blockers).  Sulfites in foods and drinks. Foods and drinks that may contain sulfites include dried fruit, potato chips, and sparkling grape juice.  Infections or inflammatory conditions such as the flu, a cold, or an inflammation of the  nasal membranes (rhinitis).  Gastroesophageal reflux disease (GERD).  Exercise or strenuous activity. What are the signs or symptoms? Symptoms may occur immediately after asthma is triggered or many hours later. Symptoms include:  Wheezing.  Excessive nighttime or early morning coughing.  Frequent or severe coughing with a common cold.  Chest tightness.  Shortness of breath. How is this diagnosed? The diagnosis of asthma is made by a review of your medical history and a physical exam. Tests may also be performed. These may include:  Lung function studies. These tests show how much air you breathe in and out.  Allergy tests.  Imaging tests such as X-rays. How is this treated? Asthma cannot be cured, but it can usually be controlled.  Treatment involves identifying and avoiding your asthma triggers. It also involves medicines. There are 2 classes of medicine used for asthma treatment:  Controller medicines. These prevent asthma symptoms from occurring. They are usually taken every day.  Reliever or rescue medicines. These quickly relieve asthma symptoms. They are used as needed and provide short-term relief. Your health care provider will help you create an asthma action plan. An asthma action plan is a written plan for managing and treating your asthma attacks. It includes a list of your asthma triggers and how they may be avoided. It also includes information on when medicines should be taken and when their dosage should be changed. An action plan may also involve the use of a device called a peak flow meter. A peak flow meter measures how well the lungs are working. It helps you monitor your condition. Follow these instructions at home:  Take medicines only as directed by your health care provider. Speak with your health care provider if you have questions about how or when to take the medicines.  Use a peak flow meter as directed by your health care provider. Record and keep track of readings.  Understand and use the action plan to help minimize or stop an asthma attack without needing to seek medical care.  Control your home environment in the following ways to help prevent asthma attacks:  Do not smoke. Avoid being exposed to secondhand smoke.  Change your heating and air conditioning filter regularly.  Limit your use of fireplaces and wood stoves.  Get rid of pests (such as roaches and mice) and their droppings.  Throw away plants if you see mold on them.  Clean your floors and dust regularly. Use unscented cleaning products.  Try to have someone else vacuum for you regularly. Stay out of rooms while they are being vacuumed and for a short while afterward. If you vacuum, use a dust mask from a hardware store, a  double-layered or microfilter vacuum cleaner bag, or a vacuum cleaner with a HEPA filter.  Replace carpet with wood, tile, or vinyl flooring. Carpet can trap dander and dust.  Use allergy-proof pillows, mattress covers, and box spring covers.  Wash bed sheets and blankets every week in hot water and dry them in a dryer.  Use blankets that are made of polyester or cotton.  Clean bathrooms and kitchens with bleach. If possible, have someone repaint the walls in these rooms with mold-resistant paint. Keep out of the rooms that are being cleaned and painted.  Wash hands frequently. Contact a health care provider if:  You have wheezing, shortness of breath, or a cough even if taking medicine to prevent attacks.  The colored mucus you cough up (sputum) is thicker than usual.  Your sputum  changes from clear or white to yellow, green, gray, or bloody.  You have any problems that may be related to the medicines you are taking (such as a rash, itching, swelling, or trouble breathing).  You are using a reliever medicine more than 2-3 times per week.  Your peak flow is still at 50-79% of your personal best after following your action plan for 1 hour.  You have a fever. Get help right away if:  You seem to be getting worse and are unresponsive to treatment during an asthma attack.  You are short of breath even at rest.  You get short of breath when doing very little physical activity.  You have difficulty eating, drinking, or talking due to asthma symptoms.  You develop chest pain.  You develop a fast heartbeat.  You have a bluish color to your lips or fingernails.  You are light-headed, dizzy, or faint.  Your peak flow is less than 50% of your personal best. This information is not intended to replace advice given to you by your health care provider. Make sure you discuss any questions you have with your health care provider. Document Released: 06/08/2005 Document Revised:  11/20/2015 Document Reviewed: 01/05/2013 Elsevier Interactive Patient Education  2017 [employer].     IF you received an x-ray today, you will receive an invoice from [facility]. Please contact [facility] at [phone] with questions or concerns regarding your invoice.   IF you received labwork today, you will receive an invoice from [facility]. Please contact [facility] at [phone] with questions or concerns regarding your invoice.   Our billing staff will not be able to assist you with questions regarding bills from these companies.  You will be contacted with the lab results as soon as they are available. The fastest way to get your results is to activate your My Chart account. Instructions are located on the last page of this paperwork. If you have not heard from [place] regarding the results in 2 weeks, please contact this office.

## 2016-09-08 NOTE — Progress Notes (Signed)
Subjective:  By signing my name below, I, Erin Mcguire, attest that this documentation has been prepared under the direction and in the presence of Erin Staggers, MD. Electronically Signed: Stann Mcguire, Scribe. 09/08/2016 , 9:47 AM .  Patient was seen in Room 14 .   Patient ID: Erin Mcguire, female    DOB: [birth year], 46 y.o.   MRN: [id number] Chief Complaint  Patient presents with  . Cough  . URI   HPI Erin Mcguire is a 46 y.o. female Here with URI symptoms. She has history of asthma, has albuterol as needed.   Patient states her symptoms started 2 days ago with a cough. Then, it progressed into congestion and rhinorrhea. She felt feverish yesterday while at work so she left early. She took a tylenol for relief. She denies measuring her temperature at that time. She hasn't been able to sleep well the past few nights due to cough. She's taken OTC cough medication.   She also reports wheezing and shortness of breath. She's been using her albuterol inhaler about 4 times 2 days ago and yesterday for relief. She also used nebulizer treatment twice last night and partial treatment this morning at 7:00AM for relief. She's been hospitalized in the past due to asthma but not recently. In a typical month, she uses her albuterol 2-3 times, even during allergy season. She is usually able to sleep through the night.   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 She's been working for about 15 years.   There are no active problems to display for this patient.  Past Medical History:  Diagnosis Date  . Asthma   . Hypertension    Past Surgical History:  Procedure Laterality Date  . TUBAL LIGATION     No Known Allergies Prior to Admission medications   Medication Sig Start Date End Date Taking? Authorizing Provider  albuterol (PROVENTIL) (2.5 MG/3ML) 0.083% nebulizer solution USE 1 VIAL IN NEBULIZER EVERY 6 HOURS AS NEEDED FOR  WHEEZING OR SHORTNESS OF BREATH 01/27/16  Yes Judeth Cornfield D English, PA  albuterol (PROVENTIL) (2.5 MG/3ML) 0.083% nebulizer solution Take 3 mLs (2.5 mg total) by nebulization every 6 (six) hours as needed for wheezing or shortness of breath. 03/20/16  Yes Doyle Askew, FNP  albuterol (VENTOLIN HFA) 108 (90 Base) MCG/ACT inhaler INHALE TWO PUFFS INTO THE LUNGS EVERY 4 HOURS AS NEEDED FOR WHEEZING/SHORTNESS OF BREATH/COUGH 03/20/16  Yes Doyle Askew, FNP  beclomethasone (QVAR) 80 MCG/ACT inhaler Inhale 2 puffs into the lungs 2 (two) times daily. 03/20/16  Yes Doyle Askew, FNP  clobetasol cream (TEMOVATE) 0.05 % Apply 1 application topically 2 (two) times daily. 06/10/16  Yes Ofilia Neas, PA-C  hydrOXYzine (ATARAX/VISTARIL) 25 MG tablet Take 0.5-1 tablets (12.5-25 mg total) by mouth every 8 (eight) hours as needed for itching. 06/18/16  Yes Collie Siad English, PA  Multiple Vitamin (MULTIVITAMIN) tablet Take 1 tablet by mouth daily.   Yes Historical Provider, MD  permethrin (ACTICIN) 5 % cream Apply from the neck down and leave on for 8 hours then shower. 06/10/16  Yes Ofilia Neas, PA-C  polyethylene glycol powder (GLYCOLAX/MIRALAX) powder Take 17 g by mouth 2 (two) times daily as needed. 03/20/16  Yes Doyle Askew, FNP  traMADol (ULTRAM) 50 MG tablet Take 1 tablet (50 mg total) by mouth every 8 (eight) hours as needed. 03/20/16  Yes Doyle Askew, FNP  triamterene-hydrochlorothiazide (MAXZIDE-25) 37.5-25 MG tablet TAKE ONE TABLET PO DAILY 03/20/16  Yes Doyle Askew, FNP   Social History   Social History  . Marital status: Single    Spouse name: N/A  . Number of children: N/A  . Years of education: N/A   Occupational History  . Not on file.   Social History Main Topics  . Smoking status: Never Smoker  . Smokeless tobacco: Never Used  . Alcohol use 0.0 oz/week  . Drug use: No  . Sexual activity: No   Other Topics  Concern  . Not on file   Social History Narrative  . No narrative on file   Review of Systems  Constitutional: Negative for chills, fatigue, fever and unexpected weight change.  HENT: Positive for congestion and rhinorrhea.   Respiratory: Positive for cough, shortness of breath and wheezing.   Gastrointestinal: Negative for constipation, diarrhea, nausea and vomiting.  Skin: Negative for rash and wound.  Neurological: Negative for dizziness and headaches.       Objective:   Physical Exam  Constitutional: She is oriented to person, place, and time. She appears well-developed and well-nourished. No distress.  HENT:  Head: Normocephalic and atraumatic.  Right Ear: Hearing, tympanic membrane, external ear and ear canal normal.  Left Ear: Hearing, tympanic membrane, external ear and ear canal normal.  Nose: Nose normal.  Mouth/Throat: Oropharynx is clear and moist and mucous membranes are normal. No oropharyngeal exudate.  Eyes: Conjunctivae and EOM are normal. Pupils are equal, round, and reactive to light.  Cardiovascular: Normal rate, regular rhythm, normal heart sounds and intact distal pulses.   No murmur heard. Pulmonary/Chest: Effort normal. No respiratory distress. She has wheezes. She has no rhonchi.  Trace expiratory wheeze, lungs otherwise clear  Neurological: She is alert and oriented to person, place, and time.  Skin: Skin is warm and dry. No rash noted.  Psychiatric: She has a normal mood and affect. Her behavior is normal.  Vitals reviewed.   Vitals:   09/08/16 0853  BP: 125/79  Pulse: 83  Resp: 17  Temp: 98.2 F (36.8 C)  TempSrc: Oral  SpO2: 97%  Weight: 178 lb (80.7 kg)  Height: 5' 6.5" (1.689 m)      Assessment & Plan:    Erin Mcguire is a 46 y.o. female Mild intermittent asthma with acute exacerbation - Plan: albuterol (PROVENTIL) (2.5 MG/3ML) 0.083% nebulizer solution, predniSONE (DELTASONE) 20 MG tablet  Mild intermittent asthma, unspecified  whether complicated - Plan: albuterol (PROVENTIL) (2.5 MG/3ML) 0.083% nebulizer solution, predniSONE (DELTASONE) 20 MG tablet  Cough - Plan: benzonatate (TESSALON) 100 MG capsule, HYDROcodone-homatropine (HYCODAN) 5-1.5 MG/5ML syrup  Acute upper respiratory infection  Suspected viral illness with flare of previous well-controlled asthma. Mild intermittent based on previous level of control, now with increasing wheeze and need for beta agonist. O2 sat normal, speaking full sentences. Afebrile.  -Start prednisone 40 mg daily for 5 days. Side effects/risks discussed  -Albuterol by inhaler or nebulizer treatment up to every 4 hours as needed until prednisone starts improving symptoms. RTC/ER precautions discussed  -Tessalon Perles 3 times a day when necessary, and agreed on hydrocodone cough syrup at night as long as she is not short of breath or wheezing. Correct use of that medication and risks were discussed  Meds ordered this encounter  Medications  . benzonatate (TESSALON) 100 MG capsule    Sig: Take 1 capsule (100 mg total) by mouth 3 (three) times daily as needed for cough.  Dispense:  20 capsule    Refill:  0  . albuterol (PROVENTIL) (2.5 MG/3ML) 0.083% nebulizer solution    Sig: Take 3 mLs (2.5 mg total) by nebulization every 4 (four) hours as needed for wheezing or shortness of breath.    Dispense:  75 mL    Refill:  1  . predniSONE (DELTASONE) 20 MG tablet    Sig: Take 2 tablets (40 mg total) by mouth daily with breakfast.    Dispense:  10 tablet    Refill:  0  . HYDROcodone-homatropine (HYCODAN) 5-1.5 MG/5ML syrup    Sig: Take 2.5-5 mLs by mouth at bedtime as needed. 6559m by mouth a bedtime as needed for cough.    Dispense:  120 mL    Refill:  0   Patient Instructions   Start prednisone this morning for asthma flare. Continue albuterol nebulizers every 4-6 hours as needed today and tomorrow. If you still require albuterol nebulizer frequently in the next 3 days, or any  worsening of symptoms sooner return here or emergency room. For cough, albuterol should be primary treatment for any wheezing, shortness of breath cough. You can also use Tessalon Perles up to 3 times per day as needed. At bedtime, as long as you are not short of breath or wheezing, he can use 1/2-1 teaspoon of the hydrocodone cough syrup for the irritant cough. Again, do not use that if you are short or breath or wheezing as albuterol would be the correct treatment with those symptoms.   Return to the clinic or go to the nearest emergency room if any of your symptoms worsen or new symptoms occur. Marland Kitchen.   Upper Respiratory Infection, Adult Most upper respiratory infections (URIs) are a viral infection of the air passages leading to the lungs. A URI affects the nose, throat, and upper air passages. The most common type of URI is nasopharyngitis and is typically referred to as "the common cold." URIs run their course and usually go away on their own. Most of the time, a URI does not require medical attention, but sometimes a bacterial infection in the upper airways can follow a viral infection. This is called a secondary infection. Sinus and middle ear infections are common types of secondary upper respiratory infections. Bacterial pneumonia can also complicate a URI. A URI can worsen asthma and chronic obstructive pulmonary disease (COPD). Sometimes, these complications can require emergency medical care and may be life threatening. What are the causes? Almost all URIs are caused by viruses. A virus is a type of germ and can spread from one person to another. What increases the risk? You may be at risk for a URI if:  You smoke.  You have chronic heart or lung disease.  You have a weakened defense (immune) system.  You are very young or very old.  You have nasal allergies or asthma.  You work in crowded or poorly ventilated areas.  You work in health care facilities or schools. What are the  signs or symptoms? Symptoms typically develop 2-3 days after you come in contact with a cold virus. Most viral URIs last 7-10 days. However, viral URIs from the influenza virus (flu virus) can last 14-18 days and are typically more severe. Symptoms may include:  Runny or stuffy (congested) nose.  Sneezing.  Cough.  Sore throat.  Headache.  Fatigue.  Fever.  Loss of appetite.  Pain in your forehead, behind your eyes, and over your cheekbones (sinus pain).  Muscle aches. How is  this diagnosed? Your health care provider may diagnose a URI by:  Physical exam.  Tests to check that your symptoms are not due to another condition such as:  Strep throat.  Sinusitis.  Pneumonia.  Asthma. How is this treated? A URI goes away on its own with time. It cannot be cured with medicines, but medicines may be prescribed or recommended to relieve symptoms. Medicines may help:  Reduce your fever.  Reduce your cough.  Relieve nasal congestion. Follow these instructions at home:  Take medicines only as directed by your health care provider.  Gargle warm saltwater or take cough drops to comfort your throat as directed by your health care provider.  Use a warm mist humidifier or inhale steam from a shower to increase air moisture. This may make it easier to breathe.  Drink enough fluid to keep your urine clear or pale yellow.  Eat soups and other clear broths and maintain good nutrition.  Rest as needed.  Return to work when your temperature has returned to normal or as your health care provider advises. You may need to stay home longer to avoid infecting others. You can also use a face mask and careful hand washing to prevent spread of the virus.  Increase the usage of your inhaler if you have asthma.  Do not use any tobacco products, including cigarettes, chewing tobacco, or electronic cigarettes. If you need help quitting, ask your health care provider. How is this  prevented? The best way to protect yourself from getting a cold is to practice good hygiene.  Avoid oral or hand contact with people with cold symptoms.  Wash your hands often if contact occurs. There is no clear evidence that vitamin C, vitamin E, echinacea, or exercise reduces the chance of developing a cold. However, it is always recommended to get plenty of rest, exercise, and practice good nutrition. Contact a health care provider if:  You are getting worse rather than better.  Your symptoms are not controlled by medicine.  You have chills.  You have worsening shortness of breath.  You have brown or red mucus.  You have yellow or brown nasal discharge.  You have pain in your face, especially when you bend forward.  You have a fever.  You have swollen neck glands.  You have pain while swallowing.  You have white areas in the back of your throat. Get help right away if:  You have severe or persistent:  Headache.  Ear pain.  Sinus pain.  Chest pain.  You have chronic lung disease and any of the following:  Wheezing.  Prolonged cough.  Coughing up blood.  A change in your usual mucus.  You have a stiff neck.  You have changes in your:  Vision.  Hearing.  Thinking.  Mood. This information is not intended to replace advice given to you by your health care provider. Make sure you discuss any questions you have with your health care provider. Document Released: 12/02/2000 Document Revised: 02/09/2016 Document Reviewed: 09/13/2013 Elsevier Interactive Patient Education  2017 [employer]  Asthma, Adult Asthma is a recurring condition in which the airways tighten and narrow. Asthma can make it difficult to breathe. It can cause coughing, wheezing, and shortness of breath. Asthma episodes, also called asthma attacks, range from minor to life-threatening. Asthma cannot be cured, but medicines and lifestyle changes can help control it. What are the  causes? Asthma is believed to be caused by inherited (genetic) and environmental factors, but its exact  cause is unknown. Asthma may be triggered by allergens, lung infections, or irritants in the air. Asthma triggers are different for each person. Common triggers include:  Animal dander.  Dust mites.  Cockroaches.  Pollen from trees or grass.  Mold.  Smoke.  Air pollutants such as dust, household cleaners, hair sprays, aerosol sprays, paint fumes, strong chemicals, or strong odors.  Cold air, weather changes, and winds (which increase molds and pollens in the air).  Strong emotional expressions such as crying or laughing hard.  Stress.  Certain medicines (such as aspirin) or types of drugs (such as beta-blockers).  Sulfites in foods and drinks. Foods and drinks that may contain sulfites include dried fruit, potato chips, and sparkling grape juice.  Infections or inflammatory conditions such as the flu, a cold, or an inflammation of the nasal membranes (rhinitis).  Gastroesophageal reflux disease (GERD).  Exercise or strenuous activity. What are the signs or symptoms? Symptoms may occur immediately after asthma is triggered or many hours later. Symptoms include:  Wheezing.  Excessive nighttime or early morning coughing.  Frequent or severe coughing with a common cold.  Chest tightness.  Shortness of breath. How is this diagnosed? The diagnosis of asthma is made by a review of your medical history and a physical exam. Tests may also be performed. These may include:  Lung function studies. These tests show how much air you breathe in and out.  Allergy tests.  Imaging tests such as X-rays. How is this treated? Asthma cannot be cured, but it can usually be controlled. Treatment involves identifying and avoiding your asthma triggers. It also involves medicines. There are 2 classes of medicine used for asthma treatment:  Controller medicines. These prevent asthma  symptoms from occurring. They are usually taken every day.  Reliever or rescue medicines. These quickly relieve asthma symptoms. They are used as needed and provide short-term relief. Your health care provider will help you create an asthma action plan. An asthma action plan is a written plan for managing and treating your asthma attacks. It includes a list of your asthma triggers and how they may be avoided. It also includes information on when medicines should be taken and when their dosage should be changed. An action plan may also involve the use of a device called a peak flow meter. A peak flow meter measures how well the lungs are working. It helps you monitor your condition. Follow these instructions at home:  Take medicines only as directed by your health care provider. Speak with your health care provider if you have questions about how or when to take the medicines.  Use a peak flow meter as directed by your health care provider. Record and keep track of readings.  Understand and use the action plan to help minimize or stop an asthma attack without needing to seek medical care.  Control your home environment in the following ways to help prevent asthma attacks:  Do not smoke. Avoid being exposed to secondhand smoke.  Change your heating and air conditioning filter regularly.  Limit your use of fireplaces and wood stoves.  Get rid of pests (such as roaches and mice) and their droppings.  Throw away plants if you see mold on them.  Clean your floors and dust regularly. Use unscented cleaning products.  Try to have someone else vacuum for you regularly. Stay out of rooms while they are being vacuumed and for a short while afterward. If you vacuum, use a dust mask from a hardware store,  a double-layered or microfilter vacuum cleaner bag, or a vacuum cleaner with a HEPA filter.  Replace carpet with wood, tile, or vinyl flooring. Carpet can trap dander and dust.  Use allergy-proof  pillows, mattress covers, and box spring covers.  Wash bed sheets and blankets every week in hot water and dry them in a dryer.  Use blankets that are made of polyester or cotton.  Clean bathrooms and kitchens with bleach. If possible, have someone repaint the walls in these rooms with mold-resistant paint. Keep out of the rooms that are being cleaned and painted.  Wash hands frequently. Contact a health care provider if:  You have wheezing, shortness of breath, or a cough even if taking medicine to prevent attacks.  The colored mucus you cough up (sputum) is thicker than usual.  Your sputum changes from clear or white to yellow, green, gray, or bloody.  You have any problems that may be related to the medicines you are taking (such as a rash, itching, swelling, or trouble breathing).  You are using a reliever medicine more than 2-3 times per week.  Your peak flow is still at 50-79% of your personal best after following your action plan for 1 hour.  You have a fever. Get help right away if:  You seem to be getting worse and are unresponsive to treatment during an asthma attack.  You are short of breath even at rest.  You get short of breath when doing very little physical activity.  You have difficulty eating, drinking, or talking due to asthma symptoms.  You develop chest pain.  You develop a fast heartbeat.  You have a bluish color to your lips or fingernails.  You are light-headed, dizzy, or faint.  Your peak flow is less than 50% of your personal best. This information is not intended to replace advice given to you by your health care provider. Make sure you discuss any questions you have with your health care provider. Document Released: 06/08/2005 Document Revised: 11/20/2015 Document Reviewed: 01/05/2013 Elsevier Interactive Patient Education  2017 [employer].     IF you received an x-ray today, you will receive an invoice from [facility]. Please  contact [facility] at [phone] with questions or concerns regarding your invoice.   IF you received labwork today, you will receive an invoice from [facility]. Please contact [facility] at [phone] with questions or concerns regarding your invoice.   Our billing staff will not be able to assist you with questions regarding bills from these companies.  You will be contacted with the lab results as soon as they are available. The fastest way to get your results is to activate your My Chart account. Instructions are located on the last page of this paperwork. If you have not heard from [place] regarding the results in 2 weeks, please contact this office.       I personally performed the services described in this documentation, which was scribed in my presence. The recorded information has been reviewed and considered for accuracy and completeness, addended by me as needed, and agree with information above.  Signed,   Erin Staggers, MD [facility].  09/08/16 9:56 AM

## 2016-09-30 ENCOUNTER — Other Ambulatory Visit: Payer: Self-pay | Admitting: Physician Assistant

## 2016-09-30 DIAGNOSIS — J4521 Mild intermittent asthma with (acute) exacerbation: Secondary | ICD-10-CM

## 2016-09-30 DIAGNOSIS — J4520 Mild intermittent asthma, uncomplicated: Secondary | ICD-10-CM

## 2016-12-11 ENCOUNTER — Other Ambulatory Visit: Payer: Self-pay | Admitting: Physician Assistant

## 2016-12-11 DIAGNOSIS — J4521 Mild intermittent asthma with (acute) exacerbation: Secondary | ICD-10-CM

## 2016-12-11 DIAGNOSIS — J4520 Mild intermittent asthma, uncomplicated: Secondary | ICD-10-CM

## 2016-12-21 ENCOUNTER — Ambulatory Visit: Payer: Self-pay | Admitting: Physician Assistant

## 2017-01-28 IMAGING — DX DG ABDOMEN 2V
3 series · 3 of 3 positions shown · non-contrast
Comparison: None.

CLINICAL DATA: Right lower quadrant abdominal pain.

EXAM:
ABDOMEN - 2 VIEW

[abdomen erect]
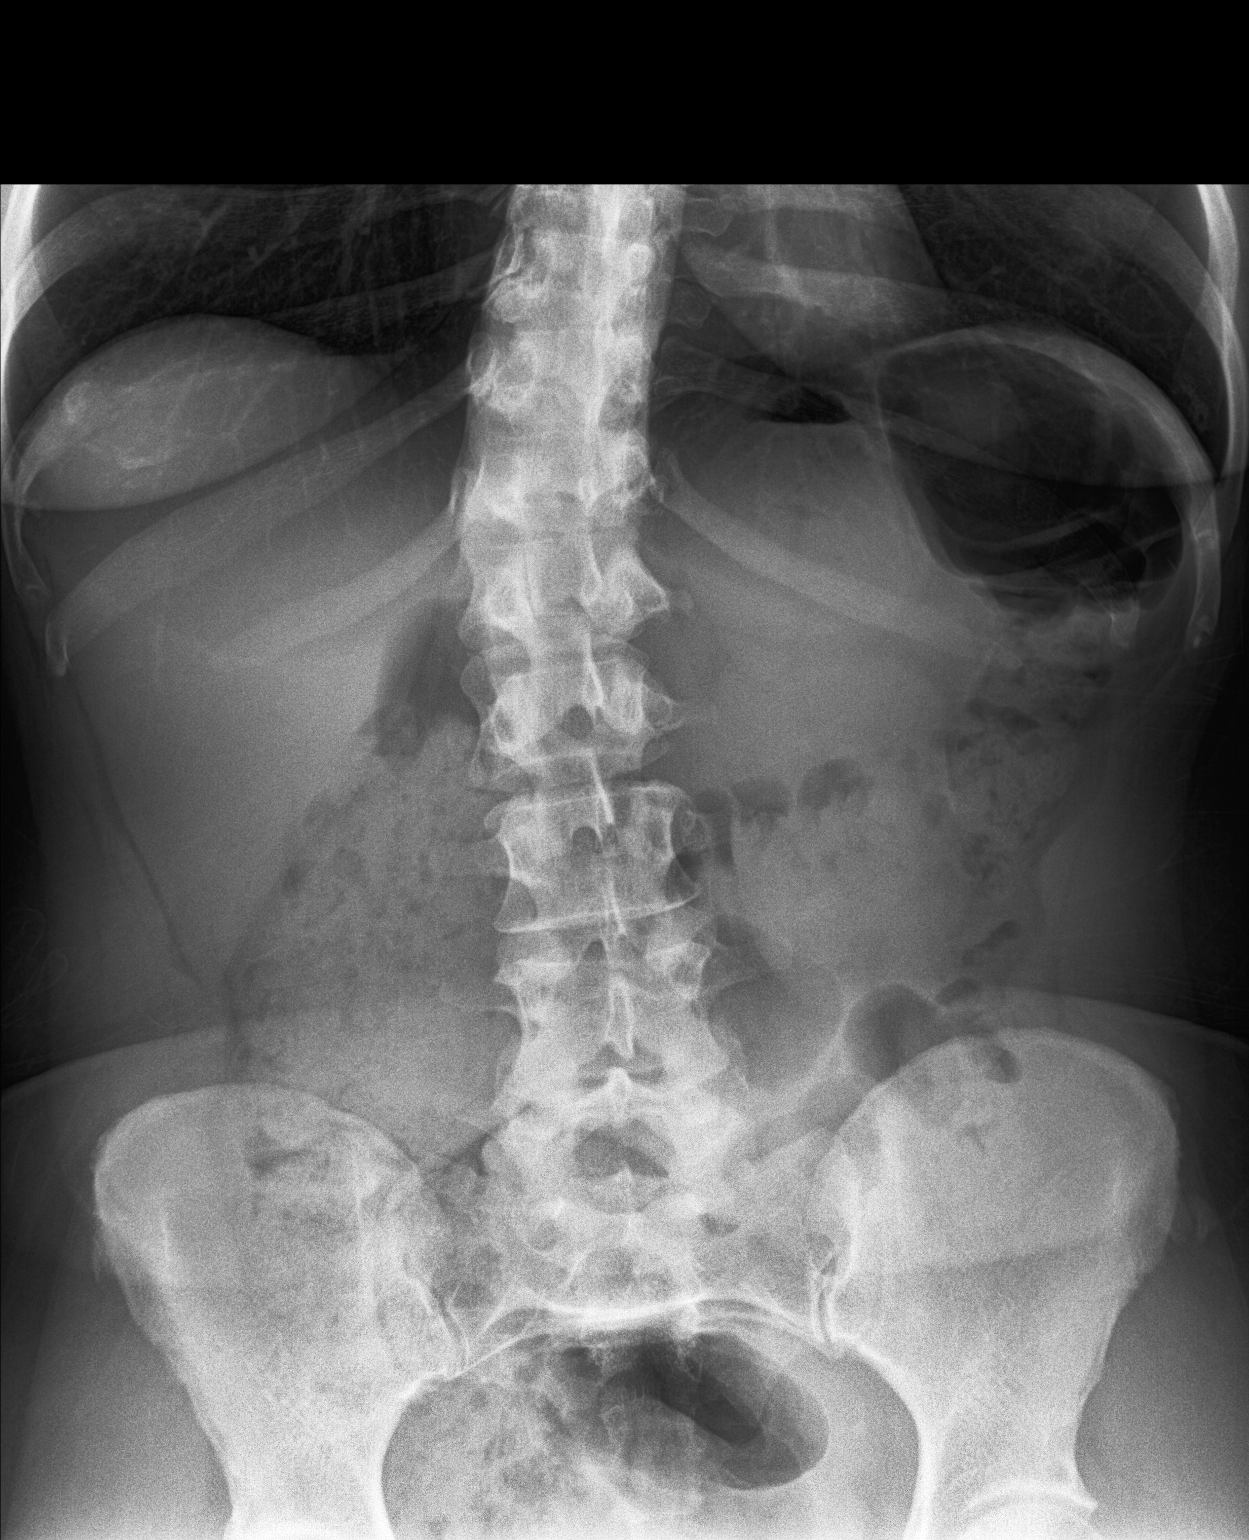

[abdomen supine (1 of 2)]
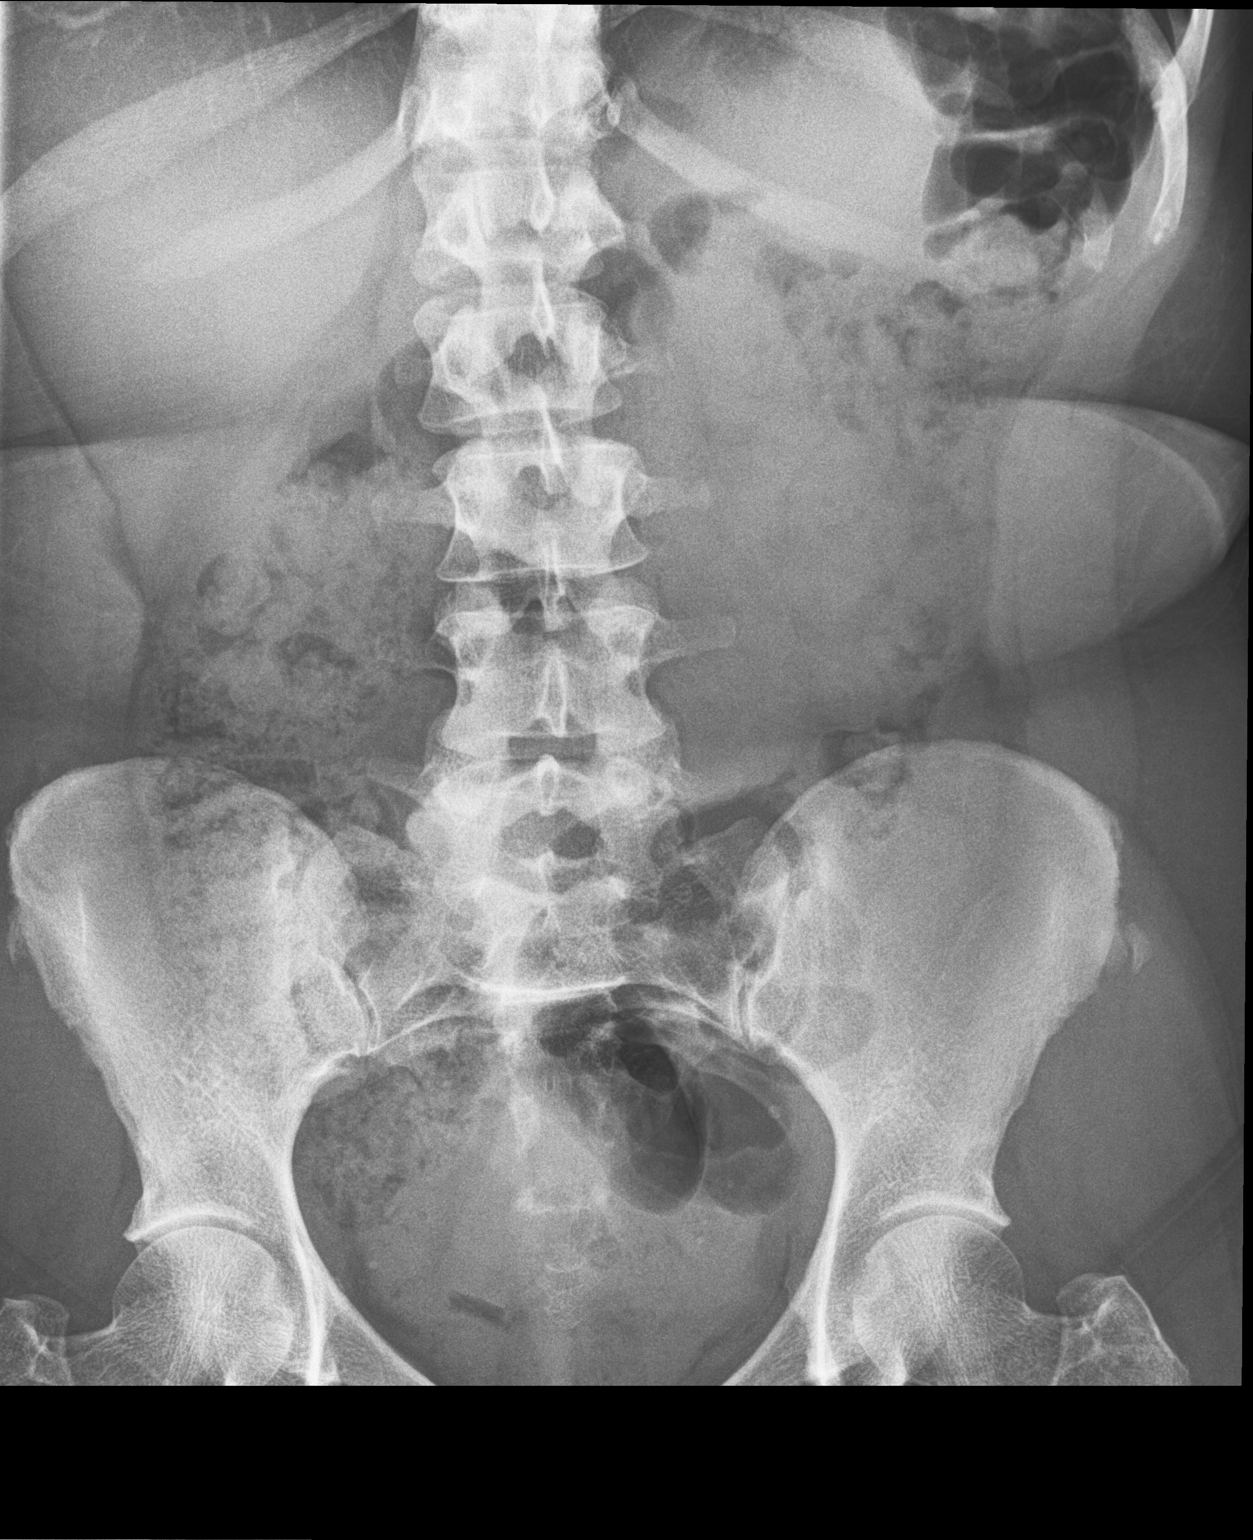

[abdomen supine (2 of 2)]
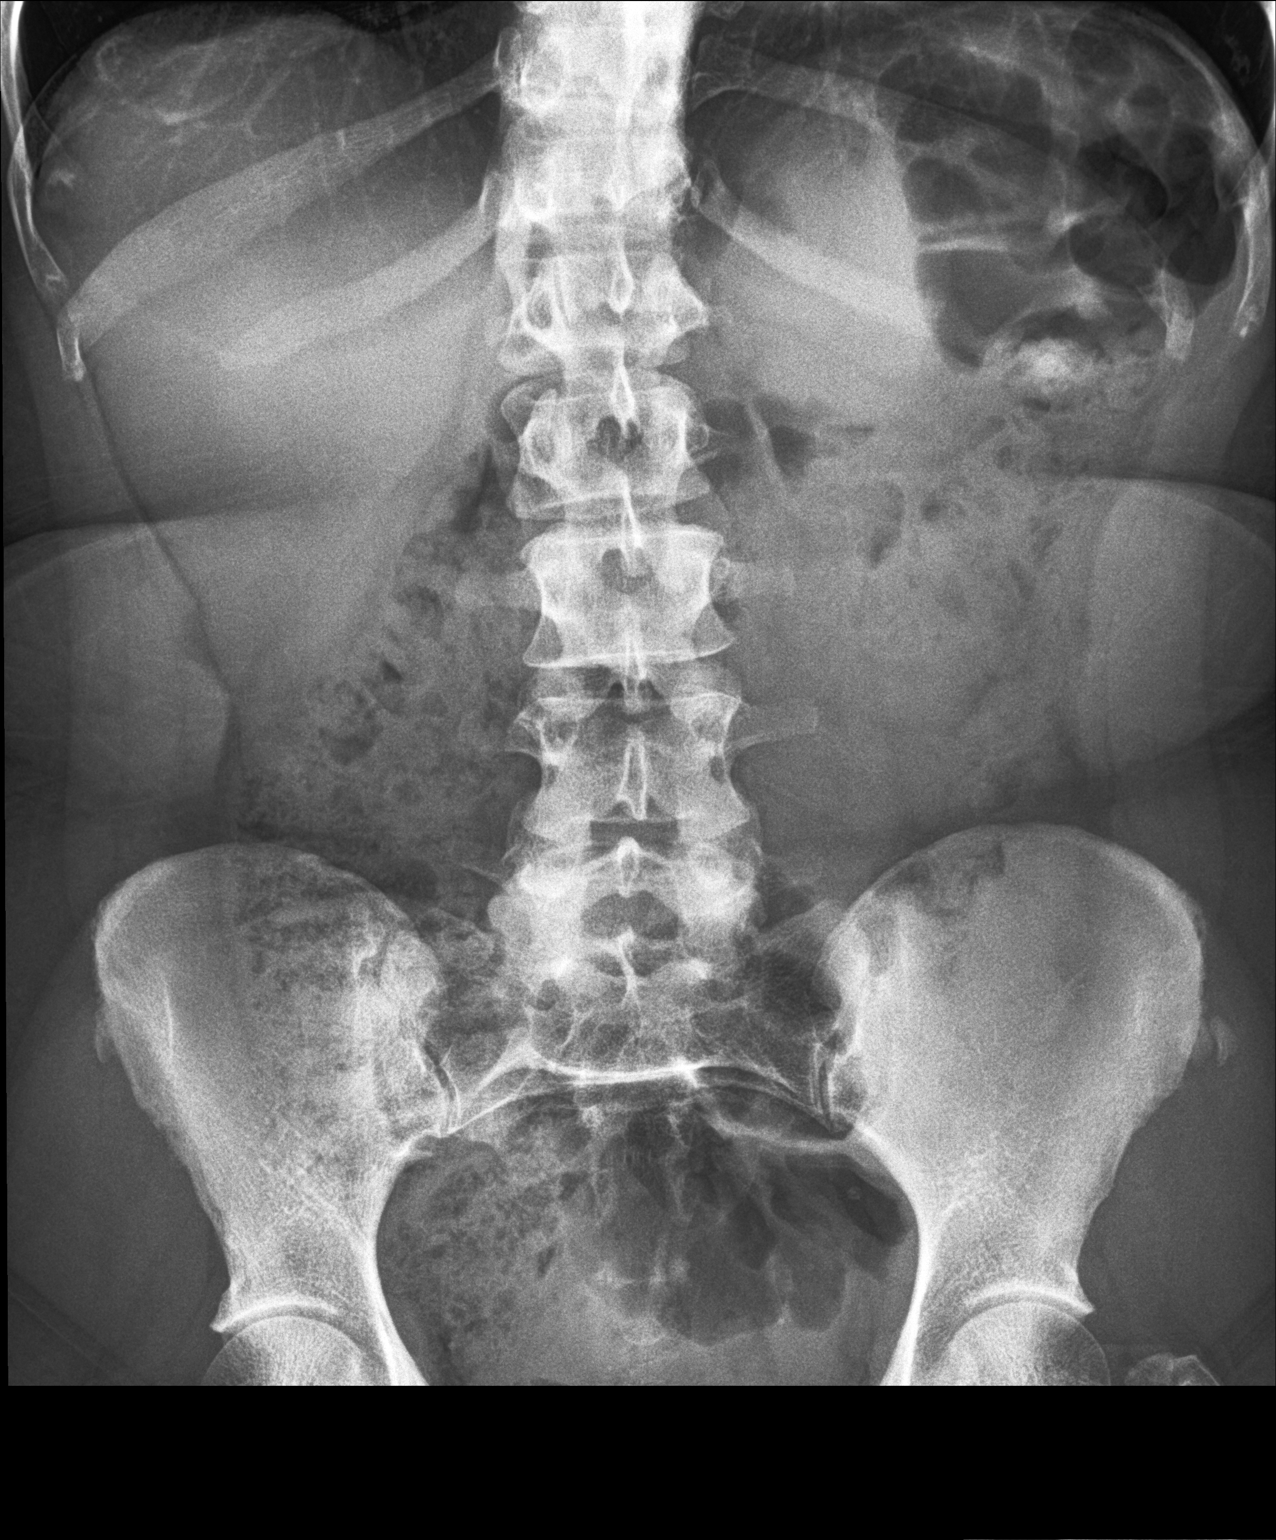

[3 of 3 positions shown; findings below may reference images not displayed]

FINDINGS: No abnormal bowel dilatation is noted. Large amount of stool is
noted throughout the colon. There is no evidence of free air. No
radio-opaque calculi or other significant radiographic abnormality
is seen.
IMPRESSION: Large amount of stool seen throughout the colon concerning for
constipation. No abnormal bowel dilatation is noted.

## 2017-01-29 ENCOUNTER — Other Ambulatory Visit: Payer: Self-pay | Admitting: Family Medicine

## 2017-01-29 DIAGNOSIS — I10 Essential (primary) hypertension: Secondary | ICD-10-CM

## 2017-03-11 ENCOUNTER — Encounter: Payer: Self-pay | Admitting: Physician Assistant

## 2017-03-11 ENCOUNTER — Ambulatory Visit (INDEPENDENT_AMBULATORY_CARE_PROVIDER_SITE_OTHER): Payer: BC Managed Care – PPO | Admitting: Physician Assistant

## 2017-03-11 VITALS — BP 115/75 | HR 74 | Temp 98.0°F | Resp 16 | Ht 66.6 in | Wt 174.0 lb

## 2017-03-11 DIAGNOSIS — J069 Acute upper respiratory infection, unspecified: Secondary | ICD-10-CM | POA: Diagnosis not present

## 2017-03-11 DIAGNOSIS — I10 Essential (primary) hypertension: Secondary | ICD-10-CM | POA: Diagnosis not present

## 2017-03-11 DIAGNOSIS — J45909 Unspecified asthma, uncomplicated: Secondary | ICD-10-CM | POA: Diagnosis not present

## 2017-03-11 MED ORDER — METHYLPREDNISOLONE ACETATE 80 MG/ML IJ SUSP
80.0000 mg | Freq: Once | INTRAMUSCULAR | Status: AC
  Administered 2017-03-11: 80 mg via INTRAMUSCULAR

## 2017-03-11 MED ORDER — TRIAMTERENE-HCTZ 37.5-25 MG PO TABS: ORAL_TABLET | ORAL | 5 refills | Status: DC

## 2017-03-11 MED ORDER — DOXYCYCLINE HYCLATE 100 MG PO CAPS: 100.0000 mg | ORAL_CAPSULE | Freq: Two times a day (BID) | ORAL | 0 refills | Status: AC

## 2017-03-11 NOTE — Progress Notes (Signed)
03/11/2017 4:32 PM   DOB: 1971/05/04 / MRN: [id number]  SUBJECTIVE:  Erin Mcguire is a 46 y.o. female presenting for sinus pressure.  This started yesterday. Associates some chest congestion and has a history of asthma.  Tells me that her breathing is okay right now but did just take some of her rescue inhaler. She has tried sudafed  (pseudoephedrine) and Theraflu with some relief.   She has No Known Allergies.   She  has a past medical history of Asthma and Hypertension.    She  reports that she has never smoked. She has never used smokeless tobacco. She reports that she drinks alcohol. She reports that she does not use drugs. She  reports that she does not engage in sexual activity. The patient  has a past surgical history that includes Tubal ligation.  Her family history includes Alcohol abuse in her maternal uncle; Asthma in her son; COPD in her father; Cancer in her maternal grandmother, paternal uncle, and sister; Heart disease in her paternal uncle; Hypertension in her father, mother, and sister; Stroke in her paternal grandmother.  Review of Systems  Constitutional: Negative for chills, diaphoresis and fever.  Eyes: Negative.   Respiratory: Positive for cough and wheezing. Negative for shortness of breath.   Cardiovascular: Negative for chest pain, orthopnea and leg swelling.  Gastrointestinal: Negative for nausea.  Skin: Negative for rash.  Neurological: Negative for dizziness, sensory change, speech change, focal weakness and headaches.    The problem list and medications were reviewed and updated by myself where necessary and exist elsewhere in the encounter.   OBJECTIVE:  BP 115/75 (BP Location: Right Arm, Patient Position: Sitting, Cuff Size: Normal)   Pulse 74   Temp 98 F (36.7 C) (Oral)   Resp 16   Ht 5' 6.6" (1.692 m)   Wt 174 lb (78.9 kg)   SpO2 98%   BMI 27.58 kg/m   Physical Exam  Constitutional: She is active.  Non-toxic appearance.  HENT:  Right  Ear: Hearing, tympanic membrane, external ear and ear canal normal.  Left Ear: Hearing, tympanic membrane, external ear and ear canal normal.  Nose: Nose normal. Right sinus exhibits no maxillary sinus tenderness and no frontal sinus tenderness. Left sinus exhibits no maxillary sinus tenderness and no frontal sinus tenderness.  Mouth/Throat: Uvula is midline, oropharynx is clear and moist and mucous membranes are normal. Mucous membranes are not dry. No oropharyngeal exudate, posterior oropharyngeal edema or tonsillar abscesses.  Cardiovascular: Normal rate, regular rhythm, S1 normal, S2 normal, normal heart sounds and intact distal pulses.  Exam reveals no gallop, no friction rub and no decreased pulses.   No murmur heard. Pulmonary/Chest: Effort normal. No stridor. No tachypnea. No respiratory distress. She has no wheezes. She has no rales.  Abdominal: She exhibits no distension.  Musculoskeletal: She exhibits no edema.  Lymphadenopathy:       Head (right side): No submandibular and no tonsillar adenopathy present.       Head (left side): No submandibular and no tonsillar adenopathy present.    She has no cervical adenopathy.  Neurological: She is alert.  Skin: Skin is warm and dry. She is not diaphoretic. No pallor.    No results found for this or any previous visit (from the past 72 hour(s)).  No results found.  ASSESSMENT AND PLAN:  Leslyn was seen today for uri.  Diagnoses and all orders for this visit:  Acute URI: Symptomatic measures for now.  Doxy if  symptoms become prolonged. She is S/P tubal ligation. See problem two.   Mild asthma without complication, unspecified whether persistent: She has been hospitalized in the past.  My fear with her is that her asthma will become involved give her new cough and she mat decline quickly. Depomedrol here to prevent asthma involvement.  -     methylPREDNISolone acetate (DEPO-MEDROL) injection 80 mg; Inject 1 mL (80 mg total) into the  muscle once.  Essential hypertension: Due for labs.  RTC in about 5 months for physical only.  -     triamterene-hydrochlorothiazide (MAXZIDE-25) 37.5-25 MG tablet; TAKE ONE TABLET PO DAILY  Other orders -     doxycycline (VIBRAMYCIN) 100 MG capsule; Take 1 capsule (100 mg total) by mouth 2 (two) times daily. Try to wait for at least 8 days total of symptoms before filling.    The patient is advised to call or return to clinic if she does not see an improvement in symptoms, or to seek the care of the closest emergency department if she worsens with the above plan.   Deliah Boston, MHS, PA-C Primary Care at [facility] 03/11/2017 4:32 PM

## 2017-03-11 NOTE — Patient Instructions (Addendum)
  Take zyrtec-D every 12 hours along with Aleve every 12 hours for symptomatic relief.  If at any point your asthma is getting worse then come back here.    IF you received an x-ray today, you will receive an invoice from [facility]. Please contact [facility] at [phone] with questions or concerns regarding your invoice.   IF you received labwork today, you will receive an invoice from [facility]. Please contact [facility] at [phone] with questions or concerns regarding your invoice.   Our billing staff will not be able to assist you with questions regarding bills from these companies.  You will be contacted with the lab results as soon as they are available. The fastest way to get your results is to activate your My Chart account. Instructions are located on the last page of this paperwork. If you have not heard from [place] regarding the results in 2 weeks, please contact this office.

## 2017-04-22 ENCOUNTER — Telehealth: Payer: Self-pay | Admitting: Physician Assistant

## 2017-04-22 NOTE — Telephone Encounter (Signed)
Pt is calling stating that she is needing a refill of her QVAR inhaler and her albuterol solution.  [facility] [address].  Please advise [phone]

## 2017-04-23 MED ORDER — BECLOMETHASONE DIPROPIONATE 80 MCG/ACT IN AERS: 2.0000 | INHALATION_SPRAY | Freq: Two times a day (BID) | RESPIRATORY_TRACT | 2 refills | Status: DC

## 2017-04-23 NOTE — Telephone Encounter (Signed)
[facility] S.Cristal Deerhristopher, CMA

## 2017-04-26 ENCOUNTER — Other Ambulatory Visit: Payer: Self-pay | Admitting: Physician Assistant

## 2017-04-26 DIAGNOSIS — J4521 Mild intermittent asthma with (acute) exacerbation: Secondary | ICD-10-CM

## 2017-04-26 DIAGNOSIS — J4520 Mild intermittent asthma, uncomplicated: Secondary | ICD-10-CM

## 2017-04-28 ENCOUNTER — Other Ambulatory Visit: Payer: Self-pay | Admitting: Physician Assistant

## 2017-04-28 DIAGNOSIS — J4520 Mild intermittent asthma, uncomplicated: Secondary | ICD-10-CM

## 2017-04-28 DIAGNOSIS — J4521 Mild intermittent asthma with (acute) exacerbation: Secondary | ICD-10-CM

## 2017-05-28 ENCOUNTER — Telehealth: Payer: Self-pay | Admitting: General Practice

## 2017-05-28 NOTE — Telephone Encounter (Signed)
Copied from CRM [id number]. Topic: Quick Communication - Rx Refill/Question >> May 28, 2017 11:44 AM Landry MellowFoltz, Melissa J wrote: Has the patient contacted their pharmacy? Yes.  They told her to contact us   (Agent: If no, request that the patient contact the pharmacy for the refill.)   [facility]with [facility] [address] - [facility] ventolin rescue inhaler.  They have previously requested this and it is not filled. The liquid for nebulizer was filled, but not inhaler    Agent: Please be advised that RX refills may take up to 3 business days. We ask that you follow-up with your pharmacy.

## 2017-05-29 ENCOUNTER — Other Ambulatory Visit: Payer: Self-pay | Admitting: Physician Assistant

## 2017-06-01 NOTE — Telephone Encounter (Signed)
Pt is requesting   A  Ventolin   Rescue  Inhaler

## 2017-06-03 NOTE — Telephone Encounter (Signed)
Please advise 

## 2017-06-07 ENCOUNTER — Other Ambulatory Visit: Payer: Self-pay | Admitting: Physician Assistant

## 2017-06-07 DIAGNOSIS — J4520 Mild intermittent asthma, uncomplicated: Secondary | ICD-10-CM

## 2017-06-07 DIAGNOSIS — J4521 Mild intermittent asthma with (acute) exacerbation: Secondary | ICD-10-CM

## 2017-06-07 MED ORDER — BECLOMETHASONE DIPROPIONATE 80 MCG/ACT IN AERS: 2.0000 | INHALATION_SPRAY | Freq: Two times a day (BID) | RESPIRATORY_TRACT | 11 refills | Status: DC

## 2017-06-07 MED ORDER — ALBUTEROL SULFATE (2.5 MG/3ML) 0.083% IN NEBU: 2.5000 mg | INHALATION_SOLUTION | Freq: Four times a day (QID) | RESPIRATORY_TRACT | 11 refills | Status: DC | PRN

## 2017-06-08 ENCOUNTER — Other Ambulatory Visit: Payer: Self-pay | Admitting: Physician Assistant

## 2017-06-08 MED ORDER — ALBUTEROL SULFATE HFA 108 (90 BASE) MCG/ACT IN AERS: 2.0000 | INHALATION_SPRAY | Freq: Four times a day (QID) | RESPIRATORY_TRACT | 0 refills | Status: DC | PRN

## 2017-06-08 NOTE — Telephone Encounter (Signed)
Pt advised.

## 2017-06-08 NOTE — Telephone Encounter (Signed)
Please call her back and let her know this was just completed. Deliah BostonMichael Clark, MS, PA-C 9:46 AM, 06/08/2017

## 2017-06-08 NOTE — Telephone Encounter (Signed)
Copied from CRM [phone]. Topic: Quick Communication - Rx Refill/Question >> May 28, 2017 11:44 AM Landry MellowFoltz, Melissa J wrote: Has the patient contacted their pharmacy? Yes.  They told her to contact us   (Agent: If no, request that the patient contact the pharmacy for the refill.)   [facility]with [facility] [address] - needs ventolin rescue inhaler.  They have previously requested this and it is not filled. The liquid for nebulizer was filled, but not inhaler    Agent: Please be advised that RX refills may take up to 3 business days. We ask that you follow-up with your pharmacy. >> Jun 07, 2017  3:29 PM Alexander BergeronBarksdale, Harvey B wrote: Pt called about the status of her ventolin inhaler, pt states the pharmacy has/will resubmit a request

## 2017-06-23 ENCOUNTER — Other Ambulatory Visit: Payer: Self-pay | Admitting: Physician Assistant

## 2017-06-24 NOTE — Telephone Encounter (Signed)
Refill for inhaler. Looks like Deliah BostonMichael Clark just refilled 06/05/17. Please advise.

## 2017-08-31 ENCOUNTER — Other Ambulatory Visit: Payer: Self-pay | Admitting: Physician Assistant

## 2017-08-31 DIAGNOSIS — I10 Essential (primary) hypertension: Secondary | ICD-10-CM

## 2017-08-31 NOTE — Telephone Encounter (Signed)
Maxzide-25 refill request  LOV 03/11/17 with Mannie StabileM Clark  No F/U appt noted.  [id number] [place]  [address]

## 2017-09-02 ENCOUNTER — Other Ambulatory Visit: Payer: Self-pay

## 2017-09-02 DIAGNOSIS — I10 Essential (primary) hypertension: Secondary | ICD-10-CM

## 2017-09-02 MED ORDER — TRIAMTERENE-HCTZ 37.5-25 MG PO TABS: ORAL_TABLET | ORAL | 5 refills | Status: DC

## 2017-09-21 ENCOUNTER — Encounter: Payer: Self-pay | Admitting: Physician Assistant

## 2017-12-08 ENCOUNTER — Other Ambulatory Visit: Payer: Self-pay | Admitting: Physician Assistant

## 2017-12-26 ENCOUNTER — Other Ambulatory Visit: Payer: Self-pay | Admitting: Physician Assistant

## 2018-02-08 ENCOUNTER — Other Ambulatory Visit: Payer: Self-pay | Admitting: Physician Assistant

## 2018-02-17 ENCOUNTER — Other Ambulatory Visit: Payer: Self-pay | Admitting: Physician Assistant

## 2018-02-18 ENCOUNTER — Other Ambulatory Visit: Payer: Self-pay

## 2018-02-18 MED ORDER — ALBUTEROL SULFATE HFA 108 (90 BASE) MCG/ACT IN AERS: INHALATION_SPRAY | RESPIRATORY_TRACT | 0 refills | Status: DC

## 2018-03-07 ENCOUNTER — Other Ambulatory Visit: Payer: Self-pay | Admitting: Physician Assistant

## 2018-03-08 NOTE — Telephone Encounter (Signed)
Contacted pt regarding refill request for albuterol inhaler; last office visit 03/01/17; pt schedules appointment with Dr Koren ShiverIrma Santiago, [address], 03/29/18 at 1020; she verbalizes understanding.

## 2018-03-14 ENCOUNTER — Other Ambulatory Visit: Payer: Self-pay | Admitting: Family Medicine

## 2018-03-29 ENCOUNTER — Other Ambulatory Visit: Payer: Self-pay | Admitting: Family Medicine

## 2018-03-29 ENCOUNTER — Ambulatory Visit: Payer: BC Managed Care – PPO | Admitting: Family Medicine

## 2018-03-29 ENCOUNTER — Other Ambulatory Visit: Payer: Self-pay

## 2018-03-29 ENCOUNTER — Encounter: Payer: Self-pay | Admitting: Family Medicine

## 2018-03-29 VITALS — BP 125/85 | HR 63 | Temp 98.2°F | Ht 66.6 in | Wt 184.6 lb

## 2018-03-29 DIAGNOSIS — O10019 Pre-existing essential hypertension complicating pregnancy, unspecified trimester: Secondary | ICD-10-CM | POA: Insufficient documentation

## 2018-03-29 DIAGNOSIS — I10 Essential (primary) hypertension: Secondary | ICD-10-CM

## 2018-03-29 DIAGNOSIS — J4541 Moderate persistent asthma with (acute) exacerbation: Secondary | ICD-10-CM

## 2018-03-29 LAB — BASIC METABOLIC PANEL
BUN/Creatinine Ratio: 10 (ref 9–23)
BUN: 10 mg/dL (ref 6–24)
CO2: 21 mmol/L (ref 20–29)
Calcium: 9.6 mg/dL (ref 8.7–10.2)
Chloride: 99 mmol/L (ref 96–106)
Creatinine, Ser: 1.02 mg/dL — ABNORMAL HIGH (ref 0.57–1.00)
GFR calc Af Amer: 75 mL/min/{1.73_m2} (ref >59)
GFR calc non Af Amer: 65 mL/min/{1.73_m2} (ref >59)
Glucose: 85 mg/dL (ref 65–99)
Potassium: 4.1 mmol/L (ref 3.5–5.2)
Sodium: 138 mmol/L (ref 134–144)

## 2018-03-29 MED ORDER — NEBULIZER DEVI: 0 refills | Status: DC

## 2018-03-29 MED ORDER — TRIAMTERENE-HCTZ 37.5-25 MG PO TABS: ORAL_TABLET | ORAL | 5 refills | Status: DC

## 2018-03-29 MED ORDER — BECLOMETHASONE DIPROPIONATE 80 MCG/ACT IN AERS: 2.0000 | INHALATION_SPRAY | Freq: Two times a day (BID) | RESPIRATORY_TRACT | 11 refills | Status: DC

## 2018-03-29 MED ORDER — NEBULIZER/TUBING/MOUTHPIECE KIT: PACK | 3 refills | Status: DC

## 2018-03-29 MED ORDER — ALBUTEROL SULFATE (2.5 MG/3ML) 0.083% IN NEBU: 2.5000 mg | INHALATION_SOLUTION | Freq: Four times a day (QID) | RESPIRATORY_TRACT | 11 refills | Status: DC | PRN

## 2018-03-29 MED ORDER — PREDNISONE 20 MG PO TABS: 40.0000 mg | ORAL_TABLET | Freq: Every day | ORAL | 0 refills | Status: DC

## 2018-03-29 MED ORDER — ALBUTEROL SULFATE HFA 108 (90 BASE) MCG/ACT IN AERS: 2.0000 | INHALATION_SPRAY | Freq: Four times a day (QID) | RESPIRATORY_TRACT | 4 refills | Status: DC | PRN

## 2018-03-29 NOTE — Telephone Encounter (Signed)
Pharmacy is requesting clarification/new Rx - sent for provider review.

## 2018-03-29 NOTE — Progress Notes (Signed)
10/8/201910:45 AM  Erin Mcguire 07-04-1970, 47 y.o. female [id number]  Chief Complaint  Patient presents with  . Medication Refill    needing another nebulizer machine, along ith albuterol and maxide refills    HPI:   Patient is a 47 y.o. female with past medical history significant for asthma, HTN who presents today for medication refill  Last seen a year ago Does not smoke  Uses qvar and albuterol Weather changing and URIs are triggers Using albuterol about twice a day for past several weeks, getting better Has been able to cut back to every morning Chest tightness, SOB, wheezing Allergies not well controlled with OTC allergy pills No nasal sprays used to be on Singulair before Last hosp over 20 years ago, no h/o intubation Needs nebulizer machine  Takes BP meds as prescribed Denies any side effects  Gyn at [facility]   Declines flu vaccine  Fall Risk  03/29/2018 03/11/2017 09/08/2016 06/18/2016 06/10/2016  Falls in the past year? _0      Depression screen [facility] 2/9 03/29/2018 03/11/2017 09/08/2016  Decreased Interest 0 0 0  Down, Depressed, Hopeless 0 0 0  PHQ - 2 Score 0 0 0    No Known Allergies  Prior to Admission medications   Medication Sig Start Date End Date Taking? Authorizing Provider  albuterol (PROVENTIL HFA;VENTOLIN HFA) 108 (90 Base) MCG/ACT inhaler TAKE 2 PUFFS BY MOUTH EVERY 6 HOURS AS NEEDED FOR WHEEZE OR SHORTNESS OF BREATH 03/08/18  Yes Rutherford Guys, MD  albuterol (PROVENTIL) (2.5 MG/3ML) 0.083% nebulizer solution Take 3 mLs (2.5 mg total) by nebulization every 6 (six) hours as needed for wheezing or shortness of breath. 06/07/17  Yes Tereasa Coop, PA-C  beclomethasone (QVAR) 80 MCG/ACT inhaler Inhale 2 puffs into the lungs 2 (two) times daily. 06/07/17  Yes Tereasa Coop, PA-C  clobetasol cream (TEMOVATE) 8.28 % Apply 1 application topically 2 (two) times daily. 06/10/16  Yes Tereasa Coop, PA-C    HYDROcodone-homatropine (HYCODAN) 5-1.5 MG/5ML syrup Take 2.5-5 mLs by mouth at bedtime as needed. 7mby mouth a bedtime as needed for cough. 09/08/16  Yes GWendie Agreste MD  hydrOXYzine (ATARAX/VISTARIL) 25 MG tablet Take 0.5-1 tablets (12.5-25 mg total) by mouth every 8 (eight) hours as needed for itching. 06/18/16  Yes EIvar DrapeD, PA  Multiple Vitamin (MULTIVITAMIN) tablet Take 1 tablet by mouth daily.   Yes [provider]  permethrin (ACTICIN) 5 % cream Apply from the neck down and leave on for 8 hours then shower. 06/10/16  Yes CTereasa Coop PA-C  polyethylene glycol powder (GLYCOLAX/MIRALAX) powder Take 17 g by mouth 2 (two) times daily as needed. 03/20/16  Yes HScot Jun FNP  traMADol (ULTRAM) 50 MG tablet Take 1 tablet (50 mg total) by mouth every 8 (eight) hours as needed. 03/20/16  Yes HScot Jun FNP  triamterene-hydrochlorothiazide ([id number] 37.5-25 MG tablet TAKE ONE TABLET PO DAILY 09/02/17  Yes CHillis Range   Past Medical History:  Diagnosis Date  . Asthma   . Hypertension     Past Surgical History:  Procedure Laterality Date  . TUBAL LIGATION      Social History   Tobacco Use  . Smoking status: Never Smoker  . Smokeless tobacco: Never Used  Substance Use Topics  . Alcohol use: Yes    Alcohol/week: 0.0 standard drinks    Family History  Problem Relation Age of Onset  . Hypertension  Mother   . Hypertension Father   . COPD Father   . Asthma Son   . Cancer Paternal Uncle   . Cancer Maternal Grandmother   . Stroke Paternal Grandmother   . Hypertension Sister   . Cancer Sister   . Alcohol abuse Maternal Uncle   . Heart disease Paternal Uncle     Review of Systems  Constitutional: Negative for chills and fever.  HENT: Positive for congestion.   Respiratory: Positive for shortness of breath and wheezing. Negative for cough.   Cardiovascular: Negative for chest pain, palpitations and leg swelling.   Gastrointestinal: Negative for abdominal pain, nausea and vomiting.  Endo/Heme/Allergies: Positive for environmental allergies.     OBJECTIVE:  Blood pressure 125/85, pulse 63, temperature 98.2 F (36.8 C), temperature source Oral, height 5' 6.6" (1.692 m), weight 184 lb 9.6 oz (83.7 kg), last menstrual period 03/21/2018, SpO2 100 %. Body mass index is 29.26 kg/m.   Physical Exam  Constitutional: She is oriented to person, place, and time. She appears well-developed and well-nourished.  HENT:  Head: Normocephalic and atraumatic.  Mouth/Throat: Oropharynx is clear and moist. No oropharyngeal exudate.  Eyes: Pupils are equal, round, and reactive to light. Conjunctivae and EOM are normal. No scleral icterus.  Neck: Neck supple.  Cardiovascular: Normal rate, regular rhythm and normal heart sounds. Exam reveals no gallop and no friction rub.  No murmur heard. Pulmonary/Chest: Effort normal and breath sounds normal. She has no wheezes. She has no rales.  Musculoskeletal: She exhibits no edema.  Neurological: She is alert and oriented to person, place, and time.  Skin: Skin is warm and dry.  Psychiatric: She has a normal mood and affect.  Nursing note and vitals reviewed.   ASSESSMENT and PLAN  1. Moderate persistent asthma with acute exacerbation Uncontrolled, mild exacerbation. Treating with prednisone. Adding Singulair as allergies are contributng, consider adding LABA - albuterol (PROVENTIL) (2.5 MG/3ML) 0.083% nebulizer solution; Take 3 mLs (2.5 mg total) by nebulization every 6 (six) hours as needed for wheezing or shortness of breath. - beclomethasone (QVAR) 80 MCG/ACT inhaler; Inhale 2 puffs into the lungs 2 (two) times daily. - Respiratory Therapy Supplies (NEBULIZER) DEVI; Use as needed with albuterol solution - Respiratory Therapy Supplies (NEBULIZER/TUBING/MOUTHPIECE) KIT; Use as needed with albuterol solution  2. Essential hypertension Controlled. Continue current  regime.  - triamterene-hydrochlorothiazide (MAXZIDE-25) 37.5-25 MG tablet; TAKE ONE TABLET PO DAILY - Basic metabolic panel   Other orders - predniSONE (DELTASONE) 20 MG tablet; Take 2 tablets (40 mg total) by mouth daily with breakfast. - albuterol (PROVENTIL HFA;VENTOLIN HFA) 108 (90 Base) MCG/ACT inhaler; Inhale 2 puffs into the lungs every 6 (six) hours as needed for wheezing or shortness of breath.  No follow-ups on file.    Rutherford Guys, MD Primary Care at [place], [place] [address] Ph.  [phone] Fax [phone]

## 2018-03-29 NOTE — Patient Instructions (Signed)
° ° ° °  If you have lab work done today you will be contacted with your lab results within the next 2 weeks.  If you have not heard from us then please contact us. The fastest way to get your results is to register for My Chart. ° ° °IF you received an x-ray today, you will receive an invoice from [facility]. Please contact [facility] at [phone] with questions or concerns regarding your invoice.  ° °IF you received labwork today, you will receive an invoice from [facility]. Please contact [facility] at [phone] with questions or concerns regarding your invoice.  ° °Our billing staff will not be able to assist you with questions regarding bills from these companies. ° °You will be contacted with the lab results as soon as they are available. The fastest way to get your results is to activate your My Chart account. Instructions are located on the last page of this paperwork. If you have not heard from us regarding the results in 2 weeks, please contact this office. °  ° ° ° °

## 2018-03-31 ENCOUNTER — Other Ambulatory Visit: Payer: Self-pay

## 2018-03-31 MED ORDER — BECLOMETHASONE DIPROP HFA 80 MCG/ACT IN AERB: 2.0000 | INHALATION_SPRAY | Freq: Two times a day (BID) | RESPIRATORY_TRACT | 3 refills | Status: AC

## 2018-03-31 NOTE — Telephone Encounter (Signed)
Pharmacy req chg to Qvar Redihaler - ins will only cover this   Sent

## 2018-08-12 ENCOUNTER — Other Ambulatory Visit: Payer: Self-pay | Admitting: Family Medicine

## 2018-08-12 NOTE — Telephone Encounter (Signed)
Requested Prescriptions  Pending Prescriptions Disp Refills  . albuterol (PROVENTIL HFA;VENTOLIN HFA) 108 (90 Base) MCG/ACT inhaler [Pharmacy Med Name: ALBUTEROL HFA (VENTOLIN) INH] 18 Inhaler 4    Sig: TAKE 2 PUFFS BY MOUTH EVERY 6 HOURS AS NEEDED FOR WHEEZE OR SHORTNESS OF BREATH     Pulmonology:  Beta Agonists Failed - 08/12/2018  1:19 AM      Failed - One inhaler should last at least one month. If the patient is requesting refills earlier, contact the patient to check for uncontrolled symptoms.      Passed - Valid encounter within last 12 months    Recent Outpatient Visits          4 months ago Moderate persistent asthma with acute exacerbation   Primary Care at [facility], Meda Coffee, MD   1 year ago Acute URI   Primary Care at [facility], Marolyn Hammock, PA-C   1 year ago Mild intermittent asthma with acute exacerbation   Primary Care at [facility], Asencion Partridge, MD   2 years ago Mite infestation   Primary Care at [place], [place] D, [place]   2 years ago Mite infestation   Primary Care at [facility], Marolyn Hammock, [place]

## 2018-12-20 ENCOUNTER — Other Ambulatory Visit: Payer: Self-pay | Admitting: Family Medicine

## 2018-12-20 DIAGNOSIS — I10 Essential (primary) hypertension: Secondary | ICD-10-CM

## 2019-01-04 ENCOUNTER — Other Ambulatory Visit: Payer: Self-pay | Admitting: Family Medicine

## 2019-01-04 DIAGNOSIS — I10 Essential (primary) hypertension: Secondary | ICD-10-CM

## 2019-01-22 ENCOUNTER — Other Ambulatory Visit: Payer: Self-pay | Admitting: Family Medicine

## 2019-01-22 NOTE — Telephone Encounter (Signed)
Requested Prescriptions  Pending Prescriptions Disp Refills  . albuterol (VENTOLIN HFA) 108 (90 Base) MCG/ACT inhaler [Pharmacy Med Name: ALBUTEROL HFA (PROAIR) INHALER] 8.5 g 3    Sig: TAKE 2 PUFFS BY MOUTH EVERY 6 HOURS AS NEEDED FOR WHEEZE OR SHORTNESS OF BREATH     Pulmonology:  Beta Agonists Failed - 01/22/2019  9:47 AM      Failed - One inhaler should last at least one month. If the patient is requesting refills earlier, contact the patient to check for uncontrolled symptoms.      Passed - Valid encounter within last 12 months    Recent Outpatient Visits          9 months ago Moderate persistent asthma with acute exacerbation   [facility] at [facility], Lilia Argue, MD   1 year ago Acute URI   Primary Care at [place], PA-C   2 years ago Mild intermittent asthma with acute exacerbation   [facility] Care at [facility], Ranell Patrick, MD   2 years ago Mite infestation   Primary Care at [place], Ed[place] D, [place]   2 years ago Mite infestation   Primary Care at [place], [place]

## 2019-02-03 ENCOUNTER — Other Ambulatory Visit: Payer: Self-pay | Admitting: Family Medicine

## 2019-02-03 ENCOUNTER — Other Ambulatory Visit: Payer: Self-pay | Admitting: Physician Assistant

## 2019-02-04 ENCOUNTER — Telehealth: Payer: Self-pay | Admitting: Family Medicine

## 2019-02-04 DIAGNOSIS — J4541 Moderate persistent asthma with (acute) exacerbation: Secondary | ICD-10-CM

## 2019-02-07 NOTE — Telephone Encounter (Signed)
Patient calling again, states that pharmacy advised her to contact office as they do not have refills on file.

## 2019-02-09 ENCOUNTER — Other Ambulatory Visit: Payer: Self-pay

## 2019-02-09 DIAGNOSIS — J4541 Moderate persistent asthma with (acute) exacerbation: Secondary | ICD-10-CM

## 2019-02-09 MED ORDER — ALBUTEROL SULFATE (2.5 MG/3ML) 0.083% IN NEBU: 2.5000 mg | INHALATION_SOLUTION | Freq: Four times a day (QID) | RESPIRATORY_TRACT | 1 refills | Status: DC | PRN

## 2019-02-27 ENCOUNTER — Other Ambulatory Visit: Payer: Self-pay | Admitting: Family Medicine

## 2019-02-27 DIAGNOSIS — I10 Essential (primary) hypertension: Secondary | ICD-10-CM

## 2019-02-28 NOTE — Telephone Encounter (Signed)
Requested medications are due for refill today?  Yes  Requested medications are on the active medication list?  Yes  Last refill - 12/20/2018  Future visit scheduled?  No  Notes to clinic- Patient is more than 3 months overdue for follow up visit.  Requested Prescriptions  Pending Prescriptions Disp Refills   triamterene-hydrochlorothiazide (MAXZIDE-25) 37.5-25 MG tablet [Pharmacy Med Name: TRIAMTERENE-HCTZ 37.5-25 MG TB] 30 tablet 0    Sig: TAKE 1 TABLET BY MOUTH EVERY DAY. SCHEDULE AN OFFICE VISIT     Cardiovascular: Diuretic Combos Failed - 02/27/2019  8:30 AM      Failed - Cr in normal range and within 360 days    Creat  Date Value Ref Range Status  11/03/2014 0.90 0.50 - 1.10 mg/dL Final   Creatinine, Ser  Date Value Ref Range Status  03/29/2018 1.02 (H) 0.57 - 1.00 mg/dL Final         Failed - Valid encounter within last 6 months    Recent Outpatient Visits          11 months ago Moderate persistent asthma with acute exacerbation   Primary Care at [facility], Lilia Argue, MD   1 year ago Acute URI   Primary Care at [place], PA-C   2 years ago Mild intermittent asthma with acute exacerbation   Primary Care at [facility], Ranell Patrick, MD   2 years ago Mite infestation   Primary Care at [place], [place], [place]   2 years ago Mite infestation   Primary Care at [place], Audrie Lia, PA-C             Passed - K in normal range and within 360 days    Potassium  Date Value Ref Range Status  03/29/2018 4.1 3.5 - 5.2 mmol/L Final         Passed - Na in normal range and within 360 days    Sodium  Date Value Ref Range Status  03/29/2018 138 134 - 144 mmol/L Final         Passed - Ca in normal range and within 360 days    Calcium  Date Value Ref Range Status  03/29/2018 9.6 8.7 - 10.2 mg/dL Final         Passed - Last BP in normal range    BP Readings from Last 1 Encounters:  03/29/18 125/85

## 2019-03-10 ENCOUNTER — Other Ambulatory Visit: Payer: Self-pay | Admitting: Family Medicine

## 2019-03-10 DIAGNOSIS — I10 Essential (primary) hypertension: Secondary | ICD-10-CM

## 2019-03-10 NOTE — Telephone Encounter (Signed)
Requested medication (s) are due for refill today: yes  Requested medication (s) are on the active medication list: yes  Last refill:  02/03/2019  Future visit scheduled: no  Notes to clinic:  Review for refill   Requested Prescriptions  Pending Prescriptions Disp Refills   triamterene-hydrochlorothiazide (MAXZIDE-25) 37.5-25 MG tablet [Pharmacy Med Name: TRIAMTERENE-HCTZ 37.5-25 MG TB] 30 tablet 0    Sig: TAKE 1 TABLET BY MOUTH EVERY DAY. SCHEDULE AN OFFICE VISIT     Cardiovascular: Diuretic Combos Failed - 03/10/2019 12:45 PM      Failed - Cr in normal range and within 360 days    Creat  Date Value Ref Range Status  11/03/2014 0.90 0.50 - 1.10 mg/dL Final   Creatinine, Ser  Date Value Ref Range Status  03/29/2018 1.02 (H) 0.57 - 1.00 mg/dL Final         Failed - Valid encounter within last 6 months    Recent Outpatient Visits          11 months ago Moderate persistent asthma with acute exacerbation   Primary Care at [facility], Lilia Argue, MD   1 year ago Acute URI   Primary Care at [place], PA-C   2 years ago Mild intermittent asthma with acute exacerbation   Primary Care at [facility], Ranell Patrick, MD   2 years ago Mite infestation   Primary Care at [place], [place], [place]   2 years ago Mite infestation   Primary Care at [place], Audrie Lia, PA-C             Passed - K in normal range and within 360 days    Potassium  Date Value Ref Range Status  03/29/2018 4.1 3.5 - 5.2 mmol/L Final         Passed - Na in normal range and within 360 days    Sodium  Date Value Ref Range Status  03/29/2018 138 134 - 144 mmol/L Final         Passed - Ca in normal range and within 360 days    Calcium  Date Value Ref Range Status  03/29/2018 9.6 8.7 - 10.2 mg/dL Final         Passed - Last BP in normal range    BP Readings from Last 1 Encounters:  03/29/18 125/85

## 2019-03-17 ENCOUNTER — Other Ambulatory Visit: Payer: Self-pay

## 2019-03-17 ENCOUNTER — Telehealth: Payer: Self-pay | Admitting: General Practice

## 2019-03-17 ENCOUNTER — Telehealth: Payer: Self-pay

## 2019-03-17 DIAGNOSIS — J4541 Moderate persistent asthma with (acute) exacerbation: Secondary | ICD-10-CM

## 2019-03-17 MED ORDER — NEBULIZER DEVI: 0 refills | Status: AC

## 2019-03-17 NOTE — Telephone Encounter (Signed)
Medication Refill - Medication: Home Nebulizer Rx needed   Has the patient contacted their pharmacy? Yes.   (Agent: If no, request that the patient contact the pharmacy for the refill.) (Agent: If yes, when and what did the pharmacy advise?)  Preferred Pharmacy (with phone number or street name): [facility] on [address]   Agent: Please be advised that RX refills may take up to 3 business days. We ask that you follow-up with your pharmacy.

## 2019-03-17 NOTE — Telephone Encounter (Signed)
Pt asked that rx for neb device be sent to [facility] and another unknown fax umber [phone]. Per Wilfred Curtis, it is ok to fax due to pt request

## 2019-03-17 NOTE — Telephone Encounter (Signed)
Pt requesting a cb [id number]. Currently in pharmacy

## 2019-04-06 ENCOUNTER — Other Ambulatory Visit: Payer: Self-pay | Admitting: Family Medicine

## 2019-04-06 DIAGNOSIS — I10 Essential (primary) hypertension: Secondary | ICD-10-CM

## 2019-04-06 NOTE — Telephone Encounter (Signed)
Requested medication (s) are due for refill today: yes  Requested medication (s) are on the active medication list: yes  Last refill: 03/10/2019  Future visit scheduled: no  Notes to clinic:  Review for refill Overdue for office visit    Requested Prescriptions  Pending Prescriptions Disp Refills   triamterene-hydrochlorothiazide (MAXZIDE-25) 37.5-25 MG tablet [Pharmacy Med Name: TRIAMTERENE-HCTZ 37.5-25 MG TB] 30 tablet 0    Sig: TAKE 1 TABLET BY MOUTH EVERY DAY. SCHEDULE AN OFFICE VISIT     Cardiovascular: Diuretic Combos Failed - 04/06/2019  9:35 AM      Failed - K in normal range and within 360 days    Potassium  Date Value Ref Range Status  03/29/2018 4.1 3.5 - 5.2 mmol/L Final         Failed - Na in normal range and within 360 days    Sodium  Date Value Ref Range Status  03/29/2018 138 134 - 144 mmol/L Final         Failed - Cr in normal range and within 360 days    Creat  Date Value Ref Range Status  11/03/2014 0.90 0.50 - 1.10 mg/dL Final   Creatinine, Ser  Date Value Ref Range Status  03/29/2018 1.02 (H) 0.57 - 1.00 mg/dL Final         Failed - Ca in normal range and within 360 days    Calcium  Date Value Ref Range Status  03/29/2018 9.6 8.7 - 10.2 mg/dL Final         Failed - Valid encounter within last 6 months    Recent Outpatient Visits          1 year ago Moderate persistent asthma with acute exacerbation   [facility], Lilia Argue, MD   2 years ago Acute URI   [facility] [place], PA-C   2 years ago Mild intermittent asthma with acute exacerbation   [facility], Ranell Patrick, MD   2 years ago Mite infestation   [facility] [place], [place], [place]   2 years ago Mite infestation   [facility] at [place], PA-C             Passed - Last BP in normal range    BP Readings from Last 1 Encounters:  03/29/18 125/85

## 2019-04-10 ENCOUNTER — Other Ambulatory Visit: Payer: Self-pay | Admitting: Family Medicine

## 2019-04-10 DIAGNOSIS — J4541 Moderate persistent asthma with (acute) exacerbation: Secondary | ICD-10-CM

## 2019-04-10 NOTE — Telephone Encounter (Signed)
Requested medication (s) are due for refill today: yes  Requested medication (s) are on the active medication list: yes  Last refill:  03/17/2019  Future visit scheduled: no  Notes to clinic: overdue for office visit  One inhaler should last at least one month. If the patient is requesting refills earlier      Requested Prescriptions  Pending Prescriptions Disp Refills   albuterol (PROVENTIL) (2.5 MG/3ML) 0.083% nebulizer solution [Pharmacy Med Name: ALBUTEROL SUL 2.5 MG/3 ML SOLN] 225 mL 1    Sig: Take 3 mLs (2.5 mg total) by nebulization every 6 (six) hours as needed for wheezing or shortness of breath.     Pulmonology:  Beta Agonists Failed - 04/10/2019  1:31 AM      Failed - One inhaler should last at least one month. If the patient is requesting refills earlier, contact the patient to check for uncontrolled symptoms.      Failed - Valid encounter within last 12 months    Recent Outpatient Visits          1 year ago Moderate persistent asthma with acute exacerbation   [facility], Lilia Argue, MD   2 years ago Acute URI   [facility] at [place], PA-C   2 years ago Mild intermittent asthma with acute exacerbation   [facility], Ranell Patrick, MD   2 years ago Mite infestation   [facility] at [place], [place], [place]   2 years ago Mite infestation   [facility] at [place], [place]

## 2019-05-01 ENCOUNTER — Encounter: Payer: Self-pay | Admitting: Family Medicine

## 2019-05-01 ENCOUNTER — Telehealth (INDEPENDENT_AMBULATORY_CARE_PROVIDER_SITE_OTHER): Payer: BC Managed Care – PPO | Admitting: Family Medicine

## 2019-05-01 ENCOUNTER — Other Ambulatory Visit: Payer: Self-pay

## 2019-05-01 ENCOUNTER — Telehealth: Payer: Self-pay | Admitting: Family Medicine

## 2019-05-01 VITALS — Temp 98.0°F | Ht 66.0 in | Wt 180.0 lb

## 2019-05-01 DIAGNOSIS — Z20822 Contact with and (suspected) exposure to covid-19: Secondary | ICD-10-CM

## 2019-05-01 DIAGNOSIS — Z20828 Contact with and (suspected) exposure to other viral communicable diseases: Secondary | ICD-10-CM | POA: Diagnosis not present

## 2019-05-01 NOTE — Telephone Encounter (Signed)
Pt would like Mae to know she now has her My Chart account up and running. She says you know about a letter you are suppose  to drop into her account. Please advise at [phone]

## 2019-05-01 NOTE — Progress Notes (Signed)
Virtual Visit Note  I connected with patient on 05/01/19 at 1103am by phone and verified that I am speaking with the correct person using two identifiers. Erin Mcguire is currently located at home and patient is currently with them during visit. The provider, Rutherford Guys, MD is located in their office at time of visit.  I discussed the limitations, risks, security and privacy concerns of performing an evaluation and management service by telephone and the availability of in person appointments. I also discussed with the patient that there may be a patient responsible charge related to this service. The patient expressed understanding and agreed to proceed.   CC: covid 19 exposure  HPI ? Patient is requesting to be tested covid 19 She woke up yesterday with headache and sluggish Today better, no fever or chills She was exposed to her son while mildly sx who has tested positive for covid 19 Patent has asthma, not having any no cough, no worsening SOB, no wheezing, no loss of taste or smell She has been in quarantine since she found out her son was covid 19 positive.  Will need letter for work   No Known Allergies  Prior to Admission medications   Medication Sig Start Date End Date Taking? Authorizing Provider  albuterol (PROVENTIL) (2.5 MG/3ML) 0.083% nebulizer solution TAKE 3 MLS (2.5 MG TOTAL) BY NEBULIZATION EVERY 6 (SIX) HOURS AS NEEDED FOR WHEEZING OR SHORTNESS OF BREATH. 04/10/19  Yes Rutherford Guys, MD  albuterol (VENTOLIN HFA) 108 (90 Base) MCG/ACT inhaler TAKE 2 PUFFS BY MOUTH EVERY 6 HOURS AS NEEDED FOR WHEEZE OR SHORTNESS OF BREATH 01/22/19  Yes Rutherford Guys, MD  beclomethasone (QVAR REDIHALER) 80 MCG/ACT inhaler Inhale 2 puffs into the lungs 2 (two) times daily. 03/31/18  Yes Rutherford Guys, MD  Multiple Vitamin (MULTIVITAMIN) tablet Take 1 tablet by mouth daily.   Yes [provider]  polyethylene glycol powder (GLYCOLAX/MIRALAX) powder Take 17 g by  mouth 2 (two) times daily as needed. 03/20/16  Yes Scot Jun, FNP  Respiratory Therapy Supplies (NEBULIZER) DEVI Use as needed with albuterol solution 03/17/19  Yes Rutherford Guys, MD  Respiratory Therapy Supplies (NEBULIZER/TUBING/MOUTHPIECE) KIT Use as needed with albuterol solution 03/29/18  Yes Rutherford Guys, MD  triamterene-hydrochlorothiazide (MAXZIDE-25) 37.5-25 MG tablet TAKE 1 TABLET BY MOUTH EVERY DAY. SCHEDULE AN OFFICE VISIT 04/07/19  Yes Rutherford Guys, MD    Past Medical History:  Diagnosis Date  . Asthma   . Hypertension     Past Surgical History:  Procedure Laterality Date  . TUBAL LIGATION      Social History   Tobacco Use  . Smoking status: Never Smoker  . Smokeless tobacco: Never Used  Substance Use Topics  . Alcohol use: Yes    Alcohol/week: 0.0 standard drinks    Family History  Problem Relation Age of Onset  . Hypertension Mother   . Hypertension Father   . COPD Father   . Asthma Son   . Cancer Paternal Uncle   . Cancer Maternal Grandmother   . Stroke Paternal Grandmother   . Hypertension Sister   . Cancer Sister   . Alcohol abuse Maternal Uncle   . Heart disease Paternal Uncle     ROS Per hpi  Objective  Vitals as reported by the patient: none   ASSESSMENT and PLAN 1. Exposure to COVID-19 virus  Patient evaluated, sent for testing and sent home with instructions for home care and Quarantine for  14 days. Instructed to seek further care if symptoms worsen.  .  FOLLOW-UP: prn   The above assessment and management plan was discussed with the patient. The patient verbalized understanding of and has agreed to the management plan. Patient is aware to call the clinic if symptoms persist or worsen. Patient is aware when to return to the clinic for a follow-up visit. Patient educated on when it is appropriate to go to the emergency department.    I provided 10 minutes of non-face-to-face time during this encounter.  Rutherford Guys, MD [facility] at [facility] [place], [place] [address] Ph.  [phone] Fax [phone]

## 2019-05-01 NOTE — Progress Notes (Signed)
Woke up with Headache (maybe sinus)  Requesting COVID- Son was Pos.

## 2019-05-01 NOTE — Patient Instructions (Signed)
° ° ° °  If you have lab work done today you will be contacted with your lab results within the next 2 weeks.  If you have not heard from us then please contact us. The fastest way to get your results is to register for My Chart. ° ° °IF you received an x-ray today, you will receive an invoice from [facility]. Please contact [facility] at [phone] with questions or concerns regarding your invoice.  ° °IF you received labwork today, you will receive an invoice from [facility]. Please contact [facility] at [phone] with questions or concerns regarding your invoice.  ° °Our billing staff will not be able to assist you with questions regarding bills from these companies. ° °You will be contacted with the lab results as soon as they are available. The fastest way to get your results is to activate your My Chart account. Instructions are located on the last page of this paperwork. If you have not heard from us regarding the results in 2 weeks, please contact this office. °  ° ° ° °

## 2019-05-02 LAB — NOVEL CORONAVIRUS, NAA: SARS-CoV-2, NAA: NOT DETECTED

## 2019-05-02 NOTE — Telephone Encounter (Signed)
Pt called again requesting the letter for her employer to be posted to her MyChart. Please advise

## 2019-05-05 NOTE — Telephone Encounter (Signed)
Letter was resent to pt in [place]

## 2019-05-06 ENCOUNTER — Other Ambulatory Visit: Payer: Self-pay | Admitting: Family Medicine

## 2019-05-06 DIAGNOSIS — I10 Essential (primary) hypertension: Secondary | ICD-10-CM

## 2019-05-07 NOTE — Telephone Encounter (Signed)
Requested medication (s) are due for refill today:  Yes   Requested medication (s) are on the active medication list:  yes  Future visit scheduled:  no  Last Refill: 04/07/19 #30; no refillls  Note to clinic:  Last BMP done 03/2018  Requested Prescriptions  Pending Prescriptions Disp Refills   triamterene-hydrochlorothiazide (MAXZIDE-25) 37.5-25 MG tablet [Pharmacy Med Name: TRIAMTERENE-HCTZ 37.5-25 MG TB] 30 tablet 0    Sig: TAKE 1 TABLET BY MOUTH EVERY DAY. SCHEDULE AN OFFICE VISIT     Cardiovascular: Diuretic Combos Failed - 05/06/2019  9:30 AM      Failed - K in normal range and within 360 days    Potassium  Date Value Ref Range Status  03/29/2018 4.1 3.5 - 5.2 mmol/L Final         Failed - Na in normal range and within 360 days    Sodium  Date Value Ref Range Status  03/29/2018 138 134 - 144 mmol/L Final         Failed - Cr in normal range and within 360 days    Creat  Date Value Ref Range Status  11/03/2014 0.90 0.50 - 1.10 mg/dL Final   Creatinine, Ser  Date Value Ref Range Status  03/29/2018 1.02 (H) 0.57 - 1.00 mg/dL Final         Failed - Ca in normal range and within 360 days    Calcium  Date Value Ref Range Status  03/29/2018 9.6 8.7 - 10.2 mg/dL Final         Passed - Last BP in normal range    BP Readings from Last 1 Encounters:  03/29/18 125/85         Passed - Valid encounter within last 6 months    Recent Outpatient Visits          6 days ago Exposure to COVID-19 virus   [facility], Lilia Argue, MD   1 year ago Moderate persistent asthma with acute exacerbation   [facility], Lilia Argue, MD   2 years ago Acute URI   [facility] at [place], PA-C   2 years ago Mild intermittent asthma with acute exacerbation   [facility], Ranell Patrick, MD   2 years ago Mite infestation   [facility] at [place], [place], [place]

## 2019-05-09 ENCOUNTER — Telehealth: Payer: Self-pay | Admitting: *Deleted

## 2019-05-09 NOTE — Telephone Encounter (Signed)
30 days sent in 2nd courtesy refill  No more refills until patient is seen.Marland KitchenMarland Kitchen

## 2019-05-11 NOTE — Telephone Encounter (Signed)
lvm to schedule

## 2019-05-23 ENCOUNTER — Other Ambulatory Visit: Payer: Self-pay | Admitting: Family Medicine

## 2019-05-23 DIAGNOSIS — I10 Essential (primary) hypertension: Secondary | ICD-10-CM

## 2019-05-24 NOTE — Telephone Encounter (Signed)
Requested medication (s) are due for refill today:no  Requested medication (s) are on the active medication list: yes  Last refill:  05/06/2019  Future visit scheduled: no  Notes to clinic:  Patient requesting a 90 day supply   Requested Prescriptions  Pending Prescriptions Disp Refills   triamterene-hydrochlorothiazide (MAXZIDE-25) 37.5-25 MG tablet [Pharmacy Med Name: TRIAMTERENE-HCTZ 37.5-25 MG TB] 90 tablet 1    Sig: TAKE 1 TABLET BY MOUTH EVERY DAY. SCHEDULE AN OFFICE VISIT     Cardiovascular: Diuretic Combos Failed - 05/23/2019  7:52 PM      Failed - K in normal range and within 360 days    Potassium  Date Value Ref Range Status  03/29/2018 4.1 3.5 - 5.2 mmol/L Final         Failed - Na in normal range and within 360 days    Sodium  Date Value Ref Range Status  03/29/2018 138 134 - 144 mmol/L Final         Failed - Cr in normal range and within 360 days    Creat  Date Value Ref Range Status  11/03/2014 0.90 0.50 - 1.10 mg/dL Final   Creatinine, Ser  Date Value Ref Range Status  03/29/2018 1.02 (H) 0.57 - 1.00 mg/dL Final         Failed - Ca in normal range and within 360 days    Calcium  Date Value Ref Range Status  03/29/2018 9.6 8.7 - 10.2 mg/dL Final         Passed - Last BP in normal range    BP Readings from Last 1 Encounters:  03/29/18 125/85         Passed - Valid encounter within last 6 months    Recent Outpatient Visits          3 weeks ago Exposure to COVID-19 virus   [facility], Lilia Argue, MD   1 year ago Moderate persistent asthma with acute exacerbation   [facility], Lilia Argue, MD   2 years ago Acute URI   [facility] at [place], PA-C   2 years ago Mild intermittent asthma with acute exacerbation   [facility], Ranell Patrick, MD   2 years ago Mite infestation   [facility] [place], [place], [place]

## 2019-06-15 ENCOUNTER — Other Ambulatory Visit: Payer: Self-pay | Admitting: Family Medicine

## 2019-06-15 DIAGNOSIS — J4541 Moderate persistent asthma with (acute) exacerbation: Secondary | ICD-10-CM

## 2019-06-16 ENCOUNTER — Other Ambulatory Visit: Payer: Self-pay | Admitting: Family Medicine

## 2019-06-16 DIAGNOSIS — I10 Essential (primary) hypertension: Secondary | ICD-10-CM

## 2019-06-18 NOTE — Telephone Encounter (Signed)
Forwarding medication refill request to the clinical pool for review. 

## 2019-07-11 ENCOUNTER — Other Ambulatory Visit: Payer: Self-pay | Admitting: Family Medicine

## 2019-07-11 NOTE — Telephone Encounter (Signed)
Requested Prescriptions  Pending Prescriptions Disp Refills  . albuterol (VENTOLIN HFA) 108 (90 Base) MCG/ACT inhaler [Pharmacy Med Name: ALBUTEROL HFA (PROAIR) INHALER] 8.5 g 3    Sig: TAKE 2 PUFFS BY MOUTH EVERY 6 HOURS AS NEEDED FOR WHEEZE OR SHORTNESS OF BREATH     Pulmonology:  Beta Agonists Failed - 07/11/2019 11:51 AM      Failed - One inhaler should last at least one month. If the patient is requesting refills earlier, contact the patient to check for uncontrolled symptoms.      Passed - Valid encounter within last 12 months    Recent Outpatient Visits          2 months ago Exposure to COVID-19 virus   [facility], Meda Coffee, MD   1 year ago Moderate persistent asthma with acute exacerbation   [facility], Meda Coffee, MD   2 years ago Acute URI   [facility] at [facility], Marolyn Hammock, PA-C   2 years ago Mild intermittent asthma with acute exacerbation   [facility], Asencion Partridge, MD   3 years ago Mite infestation   [facility] at [place], [place], [place]

## 2019-08-22 ENCOUNTER — Telehealth: Payer: Self-pay | Admitting: Family Medicine

## 2019-08-22 DIAGNOSIS — J4541 Moderate persistent asthma with (acute) exacerbation: Secondary | ICD-10-CM

## 2019-08-22 NOTE — Telephone Encounter (Signed)
Kiara from [facility] is needing a new prescription with a refill for nebulizer pt has prescription with no refill quantity / please fax to [phone]   Nebulizer and supplies please call Montel Clock with questions [phone]

## 2019-08-22 NOTE — Telephone Encounter (Signed)
Please Advise

## 2019-08-29 MED ORDER — NEBULIZER/TUBING/MOUTHPIECE KIT: PACK | 11 refills | Status: AC

## 2019-08-29 NOTE — Telephone Encounter (Signed)
rx'd faxed to [facility] [phone], attn Montel Clock

## 2019-09-27 ENCOUNTER — Other Ambulatory Visit: Payer: Self-pay | Admitting: Family Medicine

## 2019-09-27 DIAGNOSIS — J4541 Moderate persistent asthma with (acute) exacerbation: Secondary | ICD-10-CM

## 2019-09-27 NOTE — Telephone Encounter (Signed)
Requested Prescriptions  Pending Prescriptions Disp Refills  . albuterol (PROVENTIL) (2.5 MG/3ML) 0.083% nebulizer solution [Pharmacy Med Name: ALBUTEROL SUL 2.5 MG/3 ML SOLN] 225 mL 0    Sig: TAKE 3 MLS BY NEBULIZATION EVERY 6 (SIX) HOURS AS NEEDED FOR WHEEZING OR SHORTNESS OF BREATH.     Pulmonology:  Beta Agonists Failed - 09/27/2019  2:48 PM      Failed - One inhaler should last at least one month. If the patient is requesting refills earlier, contact the patient to check for uncontrolled symptoms.      Passed - Valid encounter within last 12 months    Recent Outpatient Visits          4 months ago Exposure to COVID-19 virus   [facility], Meda Coffee, MD   1 year ago Moderate persistent asthma with acute exacerbation   [facility], Meda Coffee, MD   2 years ago Acute URI   [facility] at [facility], Marolyn Hammock, PA-C   3 years ago Mild intermittent asthma with acute exacerbation   [facility], Asencion Partridge, MD   3 years ago Mite infestation   [facility] at [place], [place], [place]              Attempted to call pt. Left vm to call office to schedule f/u appt. with PCP.  Courtesy refill given; needs to f/u in office for further refills.

## 2019-11-24 ENCOUNTER — Other Ambulatory Visit: Payer: Self-pay | Admitting: Family Medicine

## 2019-11-24 NOTE — Telephone Encounter (Signed)
Pt is out of town / needs courtesy refill with mom in [place] for emegency will call and when do toc appt to come in   What is the name of the medication?  albuterol (PROVENTIL) (2.5 MG/3ML) 0.083% nebulizer solution [[id number]]   And  albuterol (VENTOLIN HFA) 108 (90 Base) MCG/ACT inhaler [[id number]]     Have you contacted your pharmacy to request a refill y  Please send to [id number], [place] - [address]  [address], [place] [address]  Phone:  [phone] Fax:  [phone]  DEA #:  [id number]  Patient notified that their request is being sent to the clinical staff for review and that they should receive a call once it is complete. If they do not receive a call within 72 hours they can check with their pharmacy or our office.

## 2019-11-24 NOTE — Telephone Encounter (Signed)
Call to patient to schedule appointment-( last RF courtesy) also request states she is out of town- no out of town pharmacy listed- left message to call back.

## 2019-12-20 ENCOUNTER — Other Ambulatory Visit: Payer: Self-pay | Admitting: Family Medicine

## 2019-12-20 DIAGNOSIS — I10 Essential (primary) hypertension: Secondary | ICD-10-CM

## 2019-12-20 NOTE — Telephone Encounter (Signed)
Requested medications are due for refill today? Yes  Requested medications are on active medication list?  Yes  Last Refill:   05/30/2019  # 90 with one refill  Future visit scheduled?  No   Notes to Clinic:  Medication failed RX refill protocol due to no valid encounter in the past 6 months and no labs within past 360 days.  Last labs were performed on 04/18/2018.

## 2020-01-22 ENCOUNTER — Ambulatory Visit (HOSPITAL_COMMUNITY)
Admission: EM | Admit: 2020-01-22 | Discharge: 2020-01-22 | Disposition: A | Discharge status: Home / Self Care | Payer: BC Managed Care – PPO | Attending: Family Medicine | Admitting: Family Medicine

## 2020-01-22 ENCOUNTER — Encounter (HOSPITAL_COMMUNITY): Payer: Self-pay | Admitting: Emergency Medicine

## 2020-01-22 ENCOUNTER — Other Ambulatory Visit: Payer: Self-pay

## 2020-01-22 DIAGNOSIS — M62838 Other muscle spasm: Secondary | ICD-10-CM | POA: Diagnosis not present

## 2020-01-22 MED ORDER — CYCLOBENZAPRINE HCL 5 MG PO TABS: ORAL_TABLET | ORAL | 0 refills | Status: DC

## 2020-01-22 MED ORDER — HYDROCODONE-ACETAMINOPHEN 5-325 MG PO TABS: 1.0000 | ORAL_TABLET | Freq: Four times a day (QID) | ORAL | 0 refills | Status: DC | PRN

## 2020-01-22 MED ORDER — IBUPROFEN 800 MG PO TABS: 800.0000 mg | ORAL_TABLET | Freq: Three times a day (TID) | ORAL | 0 refills | Status: AC

## 2020-01-22 NOTE — ED Provider Notes (Signed)
[facility]    CSN: [id number] Arrival date & time: 01/22/20  1705      History   Chief Complaint Chief Complaint  Patient presents with  . Torticollis    HPI Erin Mcguire is a 49 y.o. female.   HPI   Patient woke up with a crick in her neck.  It seemed better after a couple of days, now is getting worse.  Pain is in the left side of the neck.  Pain with movement.  Some associated headache.  No left arm pain.  No numbness or weakness.  Ibuprofen is not helping.  She has never had this before.  No prior neck problems or neck injury. Past Medical History:  Diagnosis Date  . Asthma   . Hypertension     Patient Active Problem List   Diagnosis Date Noted  . Benign essential HTN, chronic, antepartum 03/29/2018  . Asthma, mild intermittent 09/08/2016    Past Surgical History:  Procedure Laterality Date  . TUBAL LIGATION      OB History   No obstetric history on file.      Home Medications    Prior to Admission medications   Medication Sig Start Date End Date Taking? Authorizing Provider  albuterol (PROVENTIL) (2.5 MG/3ML) 0.083% nebulizer solution TAKE 3 MLS BY NEBULIZATION EVERY 6 (SIX) HOURS AS NEEDED FOR WHEEZING OR SHORTNESS OF BREATH. 09/27/19  Yes Rutherford Guys, MD  albuterol (VENTOLIN HFA) 108 (90 Base) MCG/ACT inhaler TAKE 2 PUFFS BY MOUTH EVERY 6 HOURS AS NEEDED FOR WHEEZE OR SHORTNESS OF BREATH 11/27/19  Yes Rutherford Guys, MD  beclomethasone (QVAR REDIHALER) 80 MCG/ACT inhaler Inhale 2 puffs into the lungs 2 (two) times daily. 03/31/18  Yes Rutherford Guys, MD  Multiple Vitamin (MULTIVITAMIN) tablet Take 1 tablet by mouth daily.   Yes [provider]  polyethylene glycol powder (GLYCOLAX/MIRALAX) powder Take 17 g by mouth 2 (two) times daily as needed. 03/20/16  Yes Scot Jun, FNP  Respiratory Therapy Supplies (NEBULIZER) DEVI Use as needed with albuterol solution 03/17/19  Yes Rutherford Guys, MD  Respiratory Therapy Supplies  (NEBULIZER/TUBING/MOUTHPIECE) KIT Use as needed with albuterol solution 08/29/19  Yes Rutherford Guys, MD  triamterene-hydrochlorothiazide (MAXZIDE-25) 37.5-25 MG tablet TAKE 1 TABLET BY MOUTH EVERY DAY. SCHEDULE AN OFFICE VISIT 12/20/19  Yes Rutherford Guys, MD  cyclobenzaprine (FLEXERIL) 5 MG tablet Take 1 or 2 at bedtime as needed muscle relaxer. 01/22/20   [name], MD  HYDROcodone-acetaminophen (NORCO/VICODIN) 5-325 MG tablet Take 1-2 tablets by mouth every 6 (six) hours as needed. 01/22/20   [name], MD  ibuprofen (ADVIL) 800 MG tablet Take 1 tablet (800 mg total) by mouth 3 (three) times daily. 01/22/20   [name], MD    Family History Family History  Problem Relation Age of Onset  . Hypertension Mother   . Hypertension Father   . COPD Father   . Asthma Son   . Cancer Paternal Uncle   . Cancer Maternal Grandmother   . Stroke Paternal Grandmother   . Hypertension Sister   . Cancer Sister   . Alcohol abuse Maternal Uncle   . Heart disease Paternal Uncle     Social History Social History   Tobacco Use  . Smoking status: Never Smoker  . Smokeless tobacco: Never Used  Substance Use Topics  . Alcohol use: Yes    Alcohol/week: 0.0 standard drinks  . Drug use: No  Allergies   Patient has no known allergies.   Review of Systems Review of Systems See HPI  Physical Exam   Vital Signs BP 134/82 (BP Location: Right Arm)   Pulse 77   Temp 98.7 F (37.1 C) (Oral)   Resp 17   LMP 01/01/2020   SpO2 97%      Physical Exam Constitutional:      General: She is not in acute distress.    Appearance: She is well-developed.     Comments: Stiff movements  HENT:     Head: Normocephalic and atraumatic.     Right Ear: Tympanic membrane normal.     Left Ear: Tympanic membrane normal.     Nose: No congestion or rhinorrhea.     Mouth/Throat:     Mouth: Mucous membranes are moist.     Pharynx: No posterior oropharyngeal erythema.  Eyes:      Conjunctiva/sclera: Conjunctivae normal.     Pupils: Pupils are equal, round, and reactive to light.  Neck:     Comments: Slow range of motion.  50% of normal.  Tenderness in the left cervical muscles and to lesser degree the left trapezius muscles. Cardiovascular:     Rate and Rhythm: Normal rate.  Pulmonary:     Effort: Pulmonary effort is normal. No respiratory distress.  Musculoskeletal:        General: Normal range of motion.     Comments: Strength sensation range of motion reflexes normal in both upper extremities  Skin:    General: Skin is warm and dry.  Neurological:     General: No focal deficit present.     Mental Status: She is alert.  Psychiatric:        Mood and Affect: Mood normal.        Behavior: Behavior normal.      UC Treatments / Results  Labs (all labs ordered are listed, but only abnormal results are displayed) Labs Reviewed - No data to display  EKG   Radiology No results found.  Procedures Procedures (including critical care time)  Medications Ordered in UC Medications - No data to display  Initial Impression / Assessment and Plan / UC Course  I have reviewed the triage vital signs and the nursing notes.  Pertinent labs & imaging results that were available during my care of the patient were reviewed by me and considered in my medical decision making (see chart for details).     Reviewed that radiology was not indicated for nontraumatic pain.  This is a muscular injury.  We will treat conservatively. Final Clinical Impressions(s) / UC Diagnoses   Final diagnoses:  Muscle spasms of neck     Discharge Instructions     Take ibuprofen 3 times a day with food Take pain medicine as needed Take muscle relaxer as needed Both the pain medicine and muscle relaxer can cause drowsiness.  These are good at bedtime . Ice or heat to area     ED Prescriptions    Medication Sig Dispense Auth. Provider   ibuprofen (ADVIL) 800 MG tablet Take 1  tablet (800 mg total) by mouth 3 (three) times daily. 21 tablet [name], MD   cyclobenzaprine (FLEXERIL) 5 MG tablet Take 1 or 2 at bedtime as needed muscle relaxer. 30 tablet [name], MD   HYDROcodone-acetaminophen (NORCO/VICODIN) 5-325 MG tablet Take 1-2 tablets by mouth every 6 (six) hours as needed. 10 tablet [name], MD     I have  reviewed the PDMP during this encounter.   [name], MD 01/22/20 2125

## 2020-01-22 NOTE — ED Triage Notes (Signed)
Pt c/o neck pain on left side that radiates into head xs 4 days. Taken Ibuprofen with only minimal relief.

## 2020-01-22 NOTE — Discharge Instructions (Signed)
Take ibuprofen 3 times a day with food Take pain medicine as needed Take muscle relaxer as needed Both the pain medicine and muscle relaxer can cause drowsiness.  These are good at bedtime . Ice or heat to area

## 2020-01-23 ENCOUNTER — Ambulatory Visit: Payer: BC Managed Care – PPO | Admitting: Registered Nurse

## 2020-01-24 ENCOUNTER — Encounter: Payer: Self-pay | Admitting: Registered Nurse

## 2020-03-27 ENCOUNTER — Other Ambulatory Visit: Payer: Self-pay | Admitting: Family Medicine

## 2020-03-27 NOTE — Telephone Encounter (Signed)
Requested Prescriptions  Pending Prescriptions Disp Refills   albuterol (VENTOLIN HFA) 108 (90 Base) MCG/ACT inhaler [Pharmacy Med Name: ALBUTEROL HFA (VENTOLIN) INH] 18 each 1    Sig: TAKE 2 PUFFS BY MOUTH EVERY 6 HOURS AS NEEDED FOR WHEEZE OR SHORTNESS OF BREATH     Pulmonology:  Beta Agonists Failed - 03/27/2020  1:21 AM      Failed - One inhaler should last at least one month. If the patient is requesting refills earlier, contact the patient to check for uncontrolled symptoms.      Passed - Valid encounter within last 12 months    Recent Outpatient Visits          11 months ago Exposure to COVID-19 virus   [facility] at [facility], Meda Coffee, MD   1 year ago Moderate persistent asthma with acute exacerbation   [facility] at [facility], Meda Coffee, MD   3 years ago Acute URI   Primary Care at [facility], Marolyn Hammock, PA-C   3 years ago Mild intermittent asthma with acute exacerbation   [facility] at [facility], Asencion Partridge, MD   3 years ago Mite infestation   Primary Care at [place], [place], [place]

## 2020-06-19 ENCOUNTER — Other Ambulatory Visit: Payer: Self-pay | Admitting: Family Medicine

## 2020-06-19 NOTE — Telephone Encounter (Signed)
Requested medication (s) are due for refill today: yes  Requested medication (s) are on the active medication list: yes  Future visit scheduled: no  Notes to clinic: Medication last filled by Dr. Leretha Pol  Patient no show appt on 01/24/2020   Requested Prescriptions  Pending Prescriptions Disp Refills   albuterol (VENTOLIN HFA) 108 (90 Base) MCG/ACT inhaler 18 each 1      Pulmonology:  Beta Agonists Failed - 06/19/2020  3:15 PM      Failed - One inhaler should last at least one month. If the patient is requesting refills earlier, contact the patient to check for uncontrolled symptoms.      Failed - Valid encounter within last 12 months    Recent Outpatient Visits           1 year ago Exposure to COVID-19 virus   Primary Care at [facility], Meda Coffee, MD   2 years ago Moderate persistent asthma with acute exacerbation   Primary Care at [facility], Meda Coffee, MD   3 years ago Acute URI   Primary Care at [facility], Marolyn Hammock, PA-C   3 years ago Mild intermittent asthma with acute exacerbation   [facility] at [facility], Asencion Partridge, MD   4 years ago Mite infestation   Primary Care at [place], [place], [place]

## 2020-06-19 NOTE — Telephone Encounter (Signed)
Medication:  albuterol (VENTOLIN HFA) 108 (90 Base) MCG/ACT inhaler [[id number]]   Has the patient contacted their pharmacy? No  (Agent: If no, request that the patient contact the pharmacy for the refill.)   Preferred Pharmacy (with phone number or street name):  [id number] [place], [place] - [address]  [address], [place] [address]  Phone:  [phone] Fax:  [phone]  Agent: Please be advised that RX refills may take up to 3 business days. We ask that you follow-up with your pharmacy.

## 2020-06-20 MED ORDER — ALBUTEROL SULFATE HFA 108 (90 BASE) MCG/ACT IN AERS: INHALATION_SPRAY | RESPIRATORY_TRACT | 1 refills | Status: DC

## 2020-06-24 ENCOUNTER — Other Ambulatory Visit: Payer: Self-pay

## 2020-06-24 ENCOUNTER — Telehealth (INDEPENDENT_AMBULATORY_CARE_PROVIDER_SITE_OTHER): Payer: BC Managed Care – PPO | Admitting: Registered Nurse

## 2020-06-24 ENCOUNTER — Telehealth: Payer: Self-pay | Admitting: General Practice

## 2020-06-24 ENCOUNTER — Encounter: Payer: Self-pay | Admitting: Registered Nurse

## 2020-06-24 DIAGNOSIS — J4541 Moderate persistent asthma with (acute) exacerbation: Secondary | ICD-10-CM

## 2020-06-24 DIAGNOSIS — Z1231 Encounter for screening mammogram for malignant neoplasm of breast: Secondary | ICD-10-CM | POA: Diagnosis not present

## 2020-06-24 DIAGNOSIS — R059 Cough, unspecified: Secondary | ICD-10-CM | POA: Diagnosis not present

## 2020-06-24 DIAGNOSIS — Z1211 Encounter for screening for malignant neoplasm of colon: Secondary | ICD-10-CM | POA: Diagnosis not present

## 2020-06-24 MED ORDER — BENZONATATE 200 MG PO CAPS: 200.0000 mg | ORAL_CAPSULE | Freq: Two times a day (BID) | ORAL | 0 refills | Status: DC | PRN

## 2020-06-24 NOTE — Telephone Encounter (Signed)
Pt calling and stated that the Pharmacy did not have her.    albuterol (PROVENTIL) (2.5 MG/3ML) 0.083% nebulizer solution [[id number]]   albuterol (VENTOLIN HFA) 108 (90 Base) MCG/ACT inhaler [[id number]]     [id number] [place] [place] - [address]

## 2020-06-24 NOTE — Patient Instructions (Signed)
° ° ° °  If you have lab work done today you will be contacted with your lab results within the next 2 weeks.  If you have not heard from us then please contact us. The fastest way to get your results is to register for My Chart. ° ° °IF you received an x-ray today, you will receive an invoice from [facility]. Please contact [facility] at [phone] with questions or concerns regarding your invoice.  ° °IF you received labwork today, you will receive an invoice from [facility]. Please contact [facility] at [phone] with questions or concerns regarding your invoice.  ° °Our billing staff will not be able to assist you with questions regarding bills from these companies. ° °You will be contacted with the lab results as soon as they are available. The fastest way to get your results is to activate your My Chart account. Instructions are located on the last page of this paperwork. If you have not heard from us regarding the results in 2 weeks, please contact this office. °  ° ° ° °

## 2020-06-24 NOTE — Progress Notes (Signed)
Telemedicine Encounter- SOAP NOTE Established Patient  This telephone encounter was conducted with the patient's (or proxy's) verbal consent via audio telecommunications: yes  Patient was instructed to have this encounter in a suitably private space; and to only have persons present to whom they give permission to participate. In addition, patient identity was confirmed by use of name plus two identifiers (DOB and address).  I discussed the limitations, risks, security and privacy concerns of performing an evaluation and management service by telephone and the availability of in person appointments. I also discussed with the patient that there may be a patient responsible charge related to this service. The patient expressed understanding and agreed to proceed.  I spent a total of 15 minutes talking with the patient or their proxy.  Patient at home Provider in office  Chief Complaint  Patient presents with  . Medication Refill    Patient states she has been having SOB ,congestion , sore throat , fatigue, and a cough. She has been taking some tylenol cold and flu but does not see a change. She states she went to have a covid test done around christmas because her daughter tested positive be she still has not got any result but all of the symptoms.    Subjective   Erin Mcguire is a 50 y.o. established patient. Telephone visit today for cough  HPI Onset Thursday Tested for covid last Tuesday - results not available No vaccine on file Shob, wheezing, fatigue, sore throat OTCs not very helpful Daughter covid positive Notes she has been on albuterol in the past for asthma. This has helped somewhat but is running out Is at home resting   Patient Active Problem List   Diagnosis Date Noted  . Benign essential HTN, chronic, antepartum 03/29/2018  . Asthma, mild intermittent 09/08/2016    Past Medical History:  Diagnosis Date  . Asthma   . Hypertension     Current Outpatient  Medications  Medication Sig Dispense Refill  . albuterol (PROVENTIL) (2.5 MG/3ML) 0.083% nebulizer solution TAKE 3 MLS BY NEBULIZATION EVERY 6 (SIX) HOURS AS NEEDED FOR WHEEZING OR SHORTNESS OF BREATH. 225 mL 0  . albuterol (VENTOLIN HFA) 108 (90 Base) MCG/ACT inhaler TAKE 2 PUFFS BY MOUTH EVERY 6 HOURS AS NEEDED FOR WHEEZE OR SHORTNESS OF BREATH 18 each 1  . benzonatate (TESSALON) 200 MG capsule Take 1 capsule (200 mg total) by mouth 2 (two) times daily as needed for cough. 20 capsule 0  . cyclobenzaprine (FLEXERIL) 5 MG tablet Take 1 or 2 at bedtime as needed muscle relaxer. 30 tablet 0  . HYDROcodone-acetaminophen (NORCO/VICODIN) 5-325 MG tablet Take 1-2 tablets by mouth every 6 (six) hours as needed. 10 tablet 0  . ibuprofen (ADVIL) 800 MG tablet Take 1 tablet (800 mg total) by mouth 3 (three) times daily. 21 tablet 0  . Multiple Vitamin (MULTIVITAMIN) tablet Take 1 tablet by mouth daily.    . polyethylene glycol powder (GLYCOLAX/MIRALAX) powder Take 17 g by mouth 2 (two) times daily as needed. 3350 g 1  . Respiratory Therapy Supplies (NEBULIZER) DEVI Use as needed with albuterol solution 1 each 0  . Respiratory Therapy Supplies (NEBULIZER/TUBING/MOUTHPIECE) KIT Use as needed with albuterol solution 1 kit 11  . triamterene-hydrochlorothiazide (MAXZIDE-25) 37.5-25 MG tablet TAKE 1 TABLET BY MOUTH EVERY DAY. SCHEDULE AN OFFICE VISIT 90 tablet 1  . beclomethasone (QVAR REDIHALER) 80 MCG/ACT inhaler Inhale 2 puffs into the lungs 2 (two) times daily. 1 Inhaler 3  No current facility-administered medications for this visit.    No Known Allergies  Social History   Socioeconomic History  . Marital status: Single    Spouse name: Not on file  . Number of children: Not on file  . Years of education: Not on file  . Highest education level: Not on file  Occupational History  . Not on file  Tobacco Use  . Smoking status: Never Smoker  . Smokeless tobacco: Never Used  Substance and Sexual  Activity  . Alcohol use: Yes    Alcohol/week: 0.0 standard drinks  . Drug use: No  . Sexual activity: Not Currently  Other Topics Concern  . Not on file  Social History Narrative  . Not on file   Social Determinants of Health   Financial Resource Strain: Not on file  Food Insecurity: Not on file  Transportation Needs: Not on file  Physical Activity: Not on file  Stress: Not on file  Social Connections: Not on file  Intimate Partner Violence: Not on file    ROS Per hpi   Objective   Vitals as reported by the patient: There were no vitals filed for this visit.  Erin Mcguire was seen today for medication refill.  Diagnoses and all orders for this visit:  Special screening for malignant neoplasms, colon -     Ambulatory referral to Gastroenterology  Screening mammogram for breast cancer -     MM Digital Screening; Future  Cough -     benzonatate (TESSALON) 200 MG capsule; Take 1 capsule (200 mg total) by mouth 2 (two) times daily as needed for cough. -     COVID-19, Flu A+B and RSV   PLAN  Drive up covid, flu, rsv test. Will follow up as warranted  Discussed tessalon and albuterol for cough relief  Discussed er precautions and return precautions  nonpharm discussed  Patient encouraged to call clinic with any questions, comments, or concerns.   I discussed the assessment and treatment plan with the patient. The patient was provided an opportunity to ask questions and all were answered. The patient agreed with the plan and demonstrated an understanding of the instructions.   The patient was advised to call back or seek an in-person evaluation if the symptoms worsen or if the condition fails to improve as anticipated.  I provided 15 minutes of non-face-to-face time during this encounter.  Maximiano Coss, NP  Primary Care at [facility]

## 2020-06-25 ENCOUNTER — Other Ambulatory Visit: Payer: Self-pay

## 2020-06-25 DIAGNOSIS — J4521 Mild intermittent asthma with (acute) exacerbation: Secondary | ICD-10-CM

## 2020-06-25 LAB — COVID-19, FLU A+B AND RSV
Influenza A, NAA: NOT DETECTED
Influenza B, NAA: NOT DETECTED
RSV, NAA: NOT DETECTED
SARS-CoV-2, NAA: DETECTED — AB

## 2020-06-25 MED ORDER — ALBUTEROL SULFATE HFA 108 (90 BASE) MCG/ACT IN AERS: INHALATION_SPRAY | RESPIRATORY_TRACT | 1 refills | Status: DC

## 2020-06-25 MED ORDER — ALBUTEROL SULFATE (2.5 MG/3ML) 0.083% IN NEBU: INHALATION_SOLUTION | RESPIRATORY_TRACT | 0 refills | Status: AC

## 2020-06-25 MED ORDER — ALBUTEROL SULFATE HFA 108 (90 BASE) MCG/ACT IN AERS: 2.0000 | INHALATION_SPRAY | Freq: Four times a day (QID) | RESPIRATORY_TRACT | 1 refills | Status: DC | PRN

## 2020-06-25 NOTE — Telephone Encounter (Signed)
Patient is calling back regarding this request .. patient did get cough mediciane but she needs  These two before she goes to pick it up  albuterol (PROVENTIL) (2.5 MG/3ML) 0.083% nebulizer solution [[id number]]   albuterol (VENTOLIN HFA) 108 (90 Base) MCG/ACT inhaler [[id number]]     [id number] [place], [place] - [address]                   Patient still has not heard any vthing   Please advise

## 2020-07-01 ENCOUNTER — Encounter: Payer: Self-pay | Admitting: Registered Nurse

## 2020-07-02 ENCOUNTER — Other Ambulatory Visit: Payer: Self-pay

## 2020-07-02 ENCOUNTER — Telehealth (INDEPENDENT_AMBULATORY_CARE_PROVIDER_SITE_OTHER): Payer: BC Managed Care – PPO | Admitting: Registered Nurse

## 2020-07-02 DIAGNOSIS — U071 COVID-19: Secondary | ICD-10-CM

## 2020-07-02 MED ORDER — AZITHROMYCIN 250 MG PO TABS: ORAL_TABLET | ORAL | 0 refills | Status: DC

## 2020-07-02 MED ORDER — PREDNISONE 10 MG PO TABS: 30.0000 mg | ORAL_TABLET | Freq: Every day | ORAL | 0 refills | Status: DC

## 2020-07-02 NOTE — Patient Instructions (Signed)
° ° ° °  If you have lab work done today you will be contacted with your lab results within the next 2 weeks.  If you have not heard from us then please contact us. The fastest way to get your results is to register for My Chart. ° ° °IF you received an x-ray today, you will receive an invoice from [facility]. Please contact [facility] at [phone] with questions or concerns regarding your invoice.  ° °IF you received labwork today, you will receive an invoice from [facility]. Please contact [facility] at [phone] with questions or concerns regarding your invoice.  ° °Our billing staff will not be able to assist you with questions regarding bills from these companies. ° °You will be contacted with the lab results as soon as they are available. The fastest way to get your results is to activate your My Chart account. Instructions are located on the last page of this paperwork. If you have not heard from us regarding the results in 2 weeks, please contact this office. °  ° ° ° °

## 2020-07-02 NOTE — Progress Notes (Signed)
Telemedicine Encounter- SOAP NOTE Established Patient  This telephone encounter was conducted with the patient's (or proxy's) verbal consent via audio telecommunications: yes  Patient was instructed to have this encounter in a suitably private space; and to only have persons present to whom they give permission to participate. In addition, patient identity was confirmed by use of name plus two identifiers (DOB and address).  I discussed the limitations, risks, security and privacy concerns of performing an evaluation and management service by telephone and the availability of in person appointments. I also discussed with the patient that there may be a patient responsible charge related to this service. The patient expressed understanding and agreed to proceed.  I spent a total of 15 minutes talking with the patient or their proxy.  Patient at home Provider in office  Chief Complaint  Patient presents with  . Covid Positive    Pt reports Positive COVID 06/24/2020 pt has a cough some slight SOB but doing okay had Possible fever,  chills last week but not in a few days, pt has been pfizer vaccinated 2 doses     Subjective   Erin Mcguire is a 50 y.o. established patient. Telephone visit today for covid follow up  HPI Ongoing cough, malaise, shortness of breath Has been using nebulizer which has been going well, but symptoms persist Was feeling better until Sunday but then worsened again, steady now Able to catch her breath  Patient Active Problem List   Diagnosis Date Noted  . Benign essential HTN, chronic, antepartum 03/29/2018  . Asthma, mild intermittent 09/08/2016    Past Medical History:  Diagnosis Date  . Asthma   . Hypertension     Current Outpatient Medications  Medication Sig Dispense Refill  . albuterol (PROVENTIL) (2.5 MG/3ML) 0.083% nebulizer solution TAKE 3 MLS BY NEBULIZATION EVERY 6 (SIX) HOURS AS NEEDED FOR WHEEZING OR SHORTNESS OF BREATH. 225 mL 0  .  albuterol (VENTOLIN HFA) 108 (90 Base) MCG/ACT inhaler Inhale 2 puffs into the lungs every 6 (six) hours as needed for wheezing or shortness of breath. 18 g 1  . azithromycin (ZITHROMAX) 250 MG tablet Take 2 tabs on first day. Then take 1 tab daily. Finish entire supply. 6 tablet 0  . beclomethasone (QVAR REDIHALER) 80 MCG/ACT inhaler Inhale 2 puffs into the lungs 2 (two) times daily. 1 Inhaler 3  . benzonatate (TESSALON) 200 MG capsule Take 1 capsule (200 mg total) by mouth 2 (two) times daily as needed for cough. 20 capsule 0  . cyclobenzaprine (FLEXERIL) 5 MG tablet Take 1 or 2 at bedtime as needed muscle relaxer. 30 tablet 0  . HYDROcodone-acetaminophen (NORCO/VICODIN) 5-325 MG tablet Take 1-2 tablets by mouth every 6 (six) hours as needed. 10 tablet 0  . ibuprofen (ADVIL) 800 MG tablet Take 1 tablet (800 mg total) by mouth 3 (three) times daily. 21 tablet 0  . Multiple Vitamin (MULTIVITAMIN) tablet Take 1 tablet by mouth daily.    . polyethylene glycol powder (GLYCOLAX/MIRALAX) powder Take 17 g by mouth 2 (two) times daily as needed. 3350 g 1  . predniSONE (DELTASONE) 10 MG tablet Take 3 tablets (30 mg total) by mouth daily with breakfast. 15 tablet 0  . Respiratory Therapy Supplies (NEBULIZER) DEVI Use as needed with albuterol solution 1 each 0  . Respiratory Therapy Supplies (NEBULIZER/TUBING/MOUTHPIECE) KIT Use as needed with albuterol solution 1 kit 11  . triamterene-hydrochlorothiazide (MAXZIDE-25) 37.5-25 MG tablet TAKE 1 TABLET BY MOUTH EVERY DAY. SCHEDULE AN  OFFICE VISIT 90 tablet 1   No current facility-administered medications for this visit.    No Known Allergies  Social History   Socioeconomic History  . Marital status: Single    Spouse name: Not on file  . Number of children: Not on file  . Years of education: Not on file  . Highest education level: Not on file  Occupational History  . Not on file  Tobacco Use  . Smoking status: Never Smoker  . Smokeless tobacco:  Never Used  Substance and Sexual Activity  . Alcohol use: Yes    Alcohol/week: 0.0 standard drinks  . Drug use: No  . Sexual activity: Not Currently  Other Topics Concern  . Not on file  Social History Narrative  . Not on file   Social Determinants of Health   Financial Resource Strain: Not on file  Food Insecurity: Not on file  Transportation Needs: Not on file  Physical Activity: Not on file  Stress: Not on file  Social Connections: Not on file  Intimate Partner Violence: Not on file    Review of Systems  Constitutional: Negative.   HENT: Negative.   Eyes: Negative.   Respiratory: Positive for cough and shortness of breath. Negative for hemoptysis, sputum production and wheezing.   Cardiovascular: Negative.   Gastrointestinal: Negative.   Genitourinary: Negative.   Musculoskeletal: Negative.   Skin: Negative.   Neurological: Negative.   Endo/Heme/Allergies: Negative.   Psychiatric/Behavioral: Negative.     Objective   Vitals as reported by the patient: There were no vitals filed for this visit.  Faun was seen today for covid positive.  Diagnoses and all orders for this visit:  COVID-19 -     azithromycin (ZITHROMAX) 250 MG tablet; Take 2 tabs on first day. Then take 1 tab daily. Finish entire supply. -     predniSONE (DELTASONE) 10 MG tablet; Take 3 tablets (30 mg total) by mouth daily with breakfast.   PLAN  z pack  Prednisone burst  Return if worsening or failing to improve  Concern at this time is developing pna  Patient encouraged to call clinic with any questions, comments, or concerns.  I discussed the assessment and treatment plan with the patient. The patient was provided an opportunity to ask questions and all were answered. The patient agreed with the plan and demonstrated an understanding of the instructions.   The patient was advised to call back or seek an in-person evaluation if the symptoms worsen or if the condition fails to improve  as anticipated.  I provided 15 minutes of non-face-to-face time during this encounter.  Maximiano Coss, NP  Primary Care at [facility]

## 2020-07-05 ENCOUNTER — Other Ambulatory Visit: Payer: Self-pay | Admitting: Registered Nurse

## 2020-07-05 DIAGNOSIS — R059 Cough, unspecified: Secondary | ICD-10-CM

## 2020-07-08 MED ORDER — BENZONATATE 200 MG PO CAPS: 200.0000 mg | ORAL_CAPSULE | Freq: Two times a day (BID) | ORAL | 0 refills | Status: AC | PRN

## 2020-07-09 ENCOUNTER — Telehealth: Payer: Self-pay | Admitting: Registered Nurse

## 2020-07-09 NOTE — Telephone Encounter (Signed)
Wrote note, called informed pt and made available on MyChart

## 2020-07-09 NOTE — Telephone Encounter (Signed)
07/09/2020 - PATIENT STATES SHE CALLED LAST WEEK TO GET A DOCTOR'S NOTE FROM [facility] AND SHE IS CALLING TODAY TO SEE IF IT HAS BEEN DONE. I DID NOT SEE A PHONE MESSAGE REGARDING A NOTE IN HER CHART. SHE NEEDS A WORK NOTE FROM [facility] 06/24/2020 AND RETURNING ON Tuesday 07/09/2020. SHE HAD COVID COMPLICATED WITH HER ASTHMA. SHE NEEDS IT AS SOON AS POSSIBLE AND WOULD LIKE IT PUT INTO MY-CHART WHEN READY. SHE WOULD ALSO LIKE TO GET A CALL FROM [place] WHEN IT HAS BEEN DONE. BEST PHONE [phone] (CELL) MBC

## 2020-08-10 ENCOUNTER — Other Ambulatory Visit: Payer: Self-pay | Admitting: Family Medicine

## 2020-08-10 DIAGNOSIS — J4521 Mild intermittent asthma with (acute) exacerbation: Secondary | ICD-10-CM

## 2020-08-10 NOTE — Telephone Encounter (Signed)
Requested Prescriptions  Pending Prescriptions Disp Refills  . albuterol (VENTOLIN HFA) 108 (90 Base) MCG/ACT inhaler [Pharmacy Med Name: ALBUTEROL HFA (VENTOLIN) INH] 18 each 1    Sig: TAKE 2 PUFFS BY MOUTH EVERY 6 HOURS AS NEEDED FOR WHEEZE OR SHORTNESS OF BREATH     Pulmonology:  Beta Agonists Failed - 08/10/2020 10:01 AM      Failed - One inhaler should last at least one month. If the patient is requesting refills earlier, contact the patient to check for uncontrolled symptoms.      Passed - Valid encounter within last 12 months    Recent Outpatient Visits          1 month ago COVID-19   [facility] at [facility], Gerlene Burdock, NP   1 month ago Special screening for malignant neoplasms, colon   [facility] Care at [facility], Gerlene Burdock, NP   1 year ago Exposure to COVID-19 virus   Primary Care at [facility], Meda Coffee, MD   2 years ago Moderate persistent asthma with acute exacerbation   Primary Care at [facility], Meda Coffee, MD   3 years ago Acute URI   Primary Care at [facility], Marolyn Hammock, PA-C

## 2020-09-05 ENCOUNTER — Encounter: Payer: Self-pay | Admitting: Gastroenterology

## 2020-09-19 ENCOUNTER — Ambulatory Visit (AMBULATORY_SURGERY_CENTER): Payer: BC Managed Care – PPO | Admitting: *Deleted

## 2020-09-19 VITALS — Ht 65.0 in | Wt 190.0 lb

## 2020-09-19 DIAGNOSIS — Z1211 Encounter for screening for malignant neoplasm of colon: Secondary | ICD-10-CM

## 2020-09-19 MED ORDER — PEG 3350-KCL-NA BICARB-NACL 420 G PO SOLR: 4000.0000 mL | Freq: Once | ORAL | 0 refills | Status: AC

## 2020-09-19 NOTE — Progress Notes (Signed)
No egg or soy allergy known to patient  No issues with past sedation with any surgeries or procedures Patient denies ever being told they had issues or difficulty with intubation  No FH of Malignant Hyperthermia No diet pills per patient No home 02 use per patient  No blood thinners per patient  Pt denies issues with constipation  No A fib or A flutter  EMMI video to pt or via MyChart  COVID 19 guidelines implemented in PV today with Pt and RN  Pt is fully vaccinated  for Covid  Pt given golytely prep.   Virtual PV.     NO PA's for preps discussed with pt In PV today  Discussed with pt there will be an out-of-pocket cost for prep and that varies from $0 to 70 dollars   Due to the COVID-19 pandemic we are asking patients to follow certain guidelines.  Pt aware of COVID protocols and [facility] guidelines

## 2020-09-25 ENCOUNTER — Encounter: Payer: Self-pay | Admitting: Gastroenterology

## 2020-10-02 ENCOUNTER — Telehealth: Payer: Self-pay | Admitting: Gastroenterology

## 2020-10-02 NOTE — Telephone Encounter (Signed)
Inbound call from patient regarding the Dulcolax laxative. States she picked up the overnight relief instead of immediate relief. Wants to know if that would be okay. Best cont # [phone]

## 2020-10-02 NOTE — Telephone Encounter (Signed)
Spoke with the patient. Explained that she did get the correct Dulcolax box.

## 2020-10-03 ENCOUNTER — Ambulatory Visit (AMBULATORY_SURGERY_CENTER): Payer: BC Managed Care – PPO | Admitting: Gastroenterology

## 2020-10-03 ENCOUNTER — Other Ambulatory Visit: Payer: Self-pay

## 2020-10-03 ENCOUNTER — Encounter: Payer: Self-pay | Admitting: Gastroenterology

## 2020-10-03 VITALS — BP 142/83 | HR 64 | Temp 98.0°F | Resp 14 | Ht 66.0 in | Wt 190.0 lb

## 2020-10-03 DIAGNOSIS — Z1211 Encounter for screening for malignant neoplasm of colon: Secondary | ICD-10-CM

## 2020-10-03 MED ORDER — SODIUM CHLORIDE 0.9 % IV SOLN: 500.0000 mL | Freq: Once | INTRAVENOUS | Status: DC

## 2020-10-03 NOTE — Patient Instructions (Signed)
YOU HAD AN ENDOSCOPIC PROCEDURE TODAY AT THE [facility]:   Refer to the procedure report that was given to you for any specific questions about what was found during the examination.  If the procedure report does not answer your questions, please call your gastroenterologist to clarify.  If you requested that your care partner not be given the details of your procedure findings, then the procedure report has been included in a sealed envelope for you to review at your convenience later.  YOU SHOULD EXPECT: Some feelings of bloating in the abdomen. Passage of more gas than usual.  Walking can help get rid of the air that was put into your GI tract during the procedure and reduce the bloating. If you had a lower endoscopy (such as a colonoscopy or flexible sigmoidoscopy) you may notice spotting of blood in your stool or on the toilet paper. If you underwent a bowel prep for your procedure, you may not have a normal bowel movement for a few days.  Please Note:  You might notice some irritation and congestion in your nose or some drainage.  This is from the oxygen used during your procedure.  There is no need for concern and it should clear up in a day or so.  SYMPTOMS TO REPORT IMMEDIATELY:   Following lower endoscopy (colonoscopy or flexible sigmoidoscopy):  Excessive amounts of blood in the stool  Significant tenderness or worsening of abdominal pains  Swelling of the abdomen that is new, acute  Fever of 100F or higher   Following upper endoscopy (EGD)  Vomiting of blood or coffee ground material  New chest pain or pain under the shoulder blades  Painful or persistently difficult swallowing  New shortness of breath  Fever of 100F or higher  Black, tarry-looking stools  For urgent or emergent issues, a gastroenterologist can be reached at any hour by calling [phone]. Do not use MyChart messaging for urgent concerns.    DIET:  We do recommend a small meal at first, but  then you may proceed to your regular diet.  Drink plenty of fluids but you should avoid alcoholic beverages for 24 hours.  ACTIVITY:  You should plan to take it easy for the rest of today and you should NOT DRIVE or use heavy machinery until tomorrow (because of the sedation medicines used during the test).    FOLLOW UP: Our staff will call the number listed on your records 48-72 hours following your procedure to check on you and address any questions or concerns that you may have regarding the information given to you following your procedure. If we do not reach you, we will leave a message.  We will attempt to reach you two times.  During this call, we will ask if you have developed any symptoms of COVID 19. If you develop any symptoms (ie: fever, flu-like symptoms, shortness of breath, cough etc.) before then, please call [phone].  If you test positive for Covid 19 in the 2 weeks post procedure, please call and report this information to us.    If any biopsies were taken you will be contacted by phone or by letter within the next 1-3 weeks.  Please call us at [phone] if you have not heard about the biopsies in 3 weeks.    SIGNATURES/CONFIDENTIALITY: You and/or your care partner have signed paperwork which will be entered into your electronic medical record.  These signatures attest to the fact that that the information above on   your After Visit Summary has been reviewed and is understood.  Full responsibility of the confidentiality of this discharge information lies with you and/or your care-partner. 

## 2020-10-03 NOTE — Op Note (Signed)
[facility] Patient Name: Erin Mcguire Procedure Date: 10/03/2020 2:26 PM MRN: [id number] Endoscopist: Sherilyn Cooter L. Myrtie Neither , MD Age: 50 Referring MD:  Date of Birth: [birth year] Gender: Female Account #: [id number] Procedure:                Colonoscopy Indications:              Screening for colorectal malignant neoplasm, This                            is the patient's first colonoscopy Medicines:                Monitored Anesthesia Care Procedure:                Pre-Anesthesia Assessment:                           - Prior to the procedure, a History and Physical                            was performed, and patient medications and                            allergies were reviewed. The patient's tolerance of                            previous anesthesia was also reviewed. The risks                            and benefits of the procedure and the sedation                            options and risks were discussed with the patient.                            All questions were answered, and informed consent                            was obtained. Prior Anticoagulants: The patient has                            taken no previous anticoagulant or antiplatelet                            agents. ASA Grade Assessment: II - A patient with                            mild systemic disease. After reviewing the risks                            and benefits, the patient was deemed in                            satisfactory condition to undergo the procedure.  After obtaining informed consent, the colonoscope                            was passed under direct vision. Throughout the                            procedure, the patient's blood pressure, pulse, and                            oxygen saturations were monitored continuously. The                            Colonoscope was introduced through the anus and                            advanced to the the cecum,  identified by                            appendiceal orifice and ileocecal valve. The                            colonoscopy was performed without difficulty. The                            patient tolerated the procedure well. The quality                            of the bowel preparation was good. The ileocecal                            valve, appendiceal orifice, and rectum were                            photographed. Scope In: 2:40:02 PM Scope Out: 2:54:51 PM Scope Withdrawal Time: 0 hours 9 minutes 57 seconds  Total Procedure Duration: 0 hours 14 minutes 49 seconds  Findings:                 The perianal and digital rectal examinations were                            normal.                           The entire examined colon appeared normal on direct                            and retroflexion views. Complications:            No immediate complications. Estimated Blood Loss:     Estimated blood loss: none. Impression:               - The entire examined colon is normal on direct and                            retroflexion views.                           -  No specimens collected. Recommendation:           - Patient has a contact number available for                            emergencies. The signs and symptoms of potential                            delayed complications were discussed with the                            patient. Return to normal activities tomorrow.                            Written discharge instructions were provided to the                            patient.                           - Resume previous diet.                           - Continue present medications.                           - Repeat colonoscopy in 10 years for screening                            purposes. Ebb Carelock L. Myrtie Neither, MD 10/03/2020 2:57:30 PM This report has been signed electronically.

## 2020-10-03 NOTE — Progress Notes (Signed)
VS-CW  Pt's states no medical or surgical changes since previsit or office visit.  

## 2020-10-03 NOTE — Progress Notes (Signed)
To PACU, VSS. Report to [email or web address] 

## 2020-10-08 ENCOUNTER — Telehealth: Payer: Self-pay | Admitting: *Deleted

## 2020-10-08 ENCOUNTER — Telehealth: Payer: Self-pay

## 2020-10-08 NOTE — Telephone Encounter (Signed)
  Follow up Call-  Call back number 10/03/2020  Post procedure Call Back phone  # [phone]  Permission to leave phone message Yes  Some recent data might be hidden     Patient questions:  Do you have a fever, pain , or abdominal swelling? No. Pain Score  0   Have you tolerated food without any problems? Yes.    Have you been able to return to your normal activities? Yes.    Do you have any questions about your discharge instructions: Diet   No. Medications  No. Follow up visit  No.  Do you have questions or concerns about your Care? No.  Actions: * If pain score is 4 or above: No action needed, pain <4.  1. Have you developed a fever since your procedure? no  2.   Have you had an respiratory symptoms (SOB or cough) since your procedure? no  3.   Have you tested positive for COVID 19 since your procedure no  4.   Have you had any family members/close contacts diagnosed with the COVID 19 since your procedure?  no   If yes to any of these questions please route to Laverna Peace, RN and Karlton Lemon, RN

## 2020-10-08 NOTE — Telephone Encounter (Signed)
Left message on follow up call. 

## 2020-10-09 ENCOUNTER — Other Ambulatory Visit: Payer: Self-pay | Admitting: Registered Nurse

## 2020-10-09 DIAGNOSIS — J4521 Mild intermittent asthma with (acute) exacerbation: Secondary | ICD-10-CM

## 2020-12-21 ENCOUNTER — Other Ambulatory Visit: Payer: Self-pay | Admitting: Registered Nurse

## 2020-12-21 DIAGNOSIS — J4521 Mild intermittent asthma with (acute) exacerbation: Secondary | ICD-10-CM

## 2021-02-07 ENCOUNTER — Other Ambulatory Visit: Payer: Self-pay | Admitting: Registered Nurse

## 2021-02-07 DIAGNOSIS — J4521 Mild intermittent asthma with (acute) exacerbation: Secondary | ICD-10-CM

## 2021-03-22 ENCOUNTER — Other Ambulatory Visit: Payer: Self-pay | Admitting: Registered Nurse

## 2021-03-22 DIAGNOSIS — J4521 Mild intermittent asthma with (acute) exacerbation: Secondary | ICD-10-CM

## 2021-05-16 ENCOUNTER — Other Ambulatory Visit: Payer: Self-pay | Admitting: Registered Nurse

## 2021-05-16 DIAGNOSIS — J4521 Mild intermittent asthma with (acute) exacerbation: Secondary | ICD-10-CM

## 2022-10-31 ENCOUNTER — Ambulatory Visit (HOSPITAL_COMMUNITY)
Admission: EM | Admit: 2022-10-31 | Discharge: 2022-10-31 | Disposition: A | Discharge status: Home / Self Care | Payer: BC Managed Care – PPO | Attending: Emergency Medicine | Admitting: Emergency Medicine

## 2022-10-31 ENCOUNTER — Other Ambulatory Visit: Payer: Self-pay

## 2022-10-31 ENCOUNTER — Encounter (HOSPITAL_COMMUNITY): Payer: Self-pay | Admitting: *Deleted

## 2022-10-31 DIAGNOSIS — M25512 Pain in left shoulder: Secondary | ICD-10-CM

## 2022-10-31 MED ORDER — CYCLOBENZAPRINE HCL 10 MG PO TABS: 10.0000 mg | ORAL_TABLET | Freq: Every day | ORAL | 0 refills | Status: AC

## 2022-10-31 MED ORDER — KETOROLAC TROMETHAMINE 30 MG/ML IJ SOLN
30.0000 mg | Freq: Once | INTRAMUSCULAR | Status: AC
  Administered 2022-10-31: 30 mg via INTRAMUSCULAR

## 2022-10-31 MED ORDER — PREDNISONE 20 MG PO TABS: 40.0000 mg | ORAL_TABLET | Freq: Every day | ORAL | 0 refills | Status: AC

## 2022-10-31 MED ORDER — KETOROLAC TROMETHAMINE 30 MG/ML IJ SOLN
INTRAMUSCULAR | Status: AC
  Filled 2022-10-31: qty 1

## 2022-10-31 NOTE — ED Provider Notes (Signed)
[facility]    CSN: [id number] Arrival date & time: 10/31/22  1003      History   Chief Complaint Chief Complaint  Patient presents with   Shoulder Pain    HPI Erin Mcguire is a 52 y.o. female.   Patient presents for evaluation anterior shoulder pain present for 5 days that occurred after laying directly on the shoulder while sleeping.  Completed a shoulder/arm workout that exacerbated symptoms causing pain to radiate down the lateral aspect of the arm, described as tension and pulling.  Pain was improving but worsened overnight prompting evaluation.  Has been using ibuprofen 800 mg consistently which is helpful but does not resolve symptoms.  Denies numbness or tingling.   Past Medical History:  Diagnosis Date   Asthma    Hyperlipidemia    Hypertension     Patient Active Problem List   Diagnosis Date Noted   Benign essential HTN, chronic, antepartum 03/29/2018   Asthma, mild intermittent 09/08/2016    Past Surgical History:  Procedure Laterality Date   TUBAL LIGATION      OB History   No obstetric history on file.      Home Medications    Prior to Admission medications   Medication Sig Start Date End Date Taking? Authorizing Provider  albuterol (PROVENTIL) (2.5 MG/3ML) 0.083% nebulizer solution TAKE 3 MLS BY NEBULIZATION EVERY 6 (SIX) HOURS AS NEEDED FOR WHEEZING OR SHORTNESS OF BREATH. 06/25/20   Just, Azalee Course, FNP  albuterol (VENTOLIN HFA) 108 (90 Base) MCG/ACT inhaler TAKE 2 PUFFS BY MOUTH EVERY 6 HOURS AS NEEDED FOR WHEEZE OR SHORTNESS OF BREATH 05/16/21   Janeece Agee, NP  beclomethasone (QVAR REDIHALER) 80 MCG/ACT inhaler Inhale 2 puffs into the lungs 2 (two) times daily. 03/31/18   Noni Saupe, MD  benzonatate (TESSALON) 200 MG capsule Take 1 capsule (200 mg total) by mouth 2 (two) times daily as needed for cough. Patient not taking: Reported on 10/03/2020 07/08/20   Janeece Agee, NP  cyclobenzaprine (FLEXERIL) 5 MG tablet Take  1 or 2 at bedtime as needed muscle relaxer. 01/22/20   Eustace Moore, MD  ibuprofen (ADVIL) 800 MG tablet Take 1 tablet (800 mg total) by mouth 3 (three) times daily. 01/22/20   Eustace Moore, MD  Multiple Vitamin (MULTIVITAMIN) tablet Take 1 tablet by mouth daily.    [provider]  polyethylene glycol powder (GLYCOLAX/MIRALAX) powder Take 17 g by mouth 2 (two) times daily as needed. 03/20/16   Bing Neighbors, NP  Respiratory Therapy Supplies (NEBULIZER) DEVI Use as needed with albuterol solution 03/17/19   Lezlie Lye, Meda Coffee, MD  Respiratory Therapy Supplies (NEBULIZER/TUBING/MOUTHPIECE) KIT Use as needed with albuterol solution 08/29/19   Lezlie Lye, Meda Coffee, MD  triamterene-hydrochlorothiazide (MAXZIDE-25) 37.5-25 MG tablet TAKE 1 TABLET BY MOUTH EVERY DAY. SCHEDULE AN OFFICE VISIT 12/20/19   Lezlie Lye, Meda Coffee, MD  WEGOVY 0.25 MG/0.5ML SOAJ SMARTSIG:0.25 Milligram(s) SUB-Q Once a Month 08/30/20   [provider]    Family History Family History  Problem Relation Age of Onset   Hypertension Mother    Hypertension Father    COPD Father    Asthma Son    Cancer Paternal Uncle    Cancer Maternal Grandmother    Stroke Paternal Grandmother    Hypertension Sister    Cancer Sister    Alcohol abuse Maternal Uncle    Heart disease Paternal Uncle    Colon polyps Neg Hx  Crohn's disease Neg Hx    Esophageal cancer Neg Hx    Stomach cancer Neg Hx    Rectal cancer Neg Hx     Social History Social History   Tobacco Use   Smoking status: Never   Smokeless tobacco: Never  Vaping Use   Vaping Use: Never used  Substance Use Topics   Alcohol use: Yes    Alcohol/week: 0.0 standard drinks of alcohol    Comment: socially once a month   Drug use: No     Allergies   Patient has no known allergies.   Review of Systems Review of Systems   Physical Exam Triage Vital Signs ED Triage Vitals  Enc Vitals Group     BP 10/31/22 1023 (!) 165/95     Pulse  Rate 10/31/22 1023 72     Resp 10/31/22 1023 18     Temp 10/31/22 1023 98.4 F (36.9 C)     Temp src --      SpO2 10/31/22 1023 94 %     Weight --      Height --      Head Circumference --      Peak Flow --      Pain Score 10/31/22 1021 6     Pain Loc --      Pain Edu? --      Excl. in GC? --    No data found.  Updated Vital Signs BP (!) 165/95   Pulse 72   Temp 98.4 F (36.9 C)   Resp 18   LMP 10/04/2022   SpO2 94%   Visual Acuity Right Eye Distance:   Left Eye Distance:   Bilateral Distance:    Right Eye Near:   Left Eye Near:    Bilateral Near:     Physical Exam Constitutional:      Appearance: Normal appearance.  Eyes:     Extraocular Movements: Extraocular movements intact.  Pulmonary:     Effort: Pulmonary effort is normal.  Musculoskeletal:     Comments: Point tenderness is present along the anterior of the left shoulder without ecchymosis, swelling or deformity, positive Hawkins sign, limited range of motion unable to fully abduct the left arm, generalized tenderness along the lateral aspect of the left shoulder without ecchymosis swelling or deformity, strength is a 4 out of 5, 2+ brachial pulse  Neurological:     Mental Status: She is alert and oriented to person, place, and time. Mental status is at baseline.      UC Treatments / Results  Labs (all labs ordered are listed, but only abnormal results are displayed) Labs Reviewed - No data to display  EKG   Radiology No results found.  Procedures Procedures (including critical care time)  Medications Ordered in UC Medications - No data to display  Initial Impression / Assessment and Plan / UC Course  I have reviewed the triage vital signs and the nursing notes.  Pertinent labs & imaging results that were available during my care of the patient were reviewed by me and considered in my medical decision making (see chart for details).  Acute left shoulder pain  Etiology is most likely  irritation to the rotator cuff, low suspicion for bone involvement therefore imaging deferred, discussed this with patient, Toradol injection given and prescribed prednisone and Flexeril for outpatient use, sling applied to be used for stability and support as range of motion is limited, recommended additional supportive measures with follow-up with orthopedics if symptoms  persist past use of medication Final Clinical Impressions(s) / UC Diagnoses   Final diagnoses:  None   Discharge Instructions   None    ED Prescriptions   None    PDMP not reviewed this encounter.   Valinda Hoar, NP 10/31/22 1043

## 2022-10-31 NOTE — ED Triage Notes (Signed)
Pt reports Lt shoulder pain started on Tuesday after she slept on the left side. Pt went to the gym and worked out and Lt shoulder continued to hurt. Pt reports she can not raise Lt arm today.

## 2022-10-31 NOTE — Discharge Instructions (Signed)
Today I believe your pain is related to irritation to the rotator cuff, I do not believe there is injury to the bone, the joint and that the shoulder is in alignment, we will hold off on completing x-ray imaging as it would not visualize the rotator cuff  You have been given an injection of Toradol today this medicine helps to reduce inflammation and ideally will start to calm your pain down in about 30 minutes to an hour  Starting tomorrow take prednisone every morning with food for 5 days to continue the above process, you may take Tylenol 500 to 1000 mg every 6 hours as needed in addition to this  You may use muscle relaxant at bedtime as needed for additional comfort, be mindful of this will make you drowsy  You have been given a sling for stability and support, you may continue activity for which you can tolerate  You may use ice or heat over the affected area in 10 to 15-minute intervals  You may massage and stretch area as tolerated  If your symptoms continue to persist past use of medicine and supportive care please follow-up with orthopedics or the specialist, information is listed on front page
